# Patient Record
Sex: Female | Born: 1935 | Race: White | Smoking: Never smoker
Health system: Southern US, Community
[De-identification: ages and names within clinical notes are randomized; demographics above are authoritative.]

## PROBLEM LIST (undated history)

## (undated) DIAGNOSIS — I639 Cerebral infarction, unspecified: Secondary | ICD-10-CM

## (undated) DIAGNOSIS — I2510 Atherosclerotic heart disease of native coronary artery without angina pectoris: Secondary | ICD-10-CM

## (undated) DIAGNOSIS — K5730 Diverticulosis of large intestine without perforation or abscess without bleeding: Secondary | ICD-10-CM

## (undated) DIAGNOSIS — H409 Unspecified glaucoma: Secondary | ICD-10-CM

## (undated) DIAGNOSIS — E039 Hypothyroidism, unspecified: Secondary | ICD-10-CM

## (undated) DIAGNOSIS — E079 Disorder of thyroid, unspecified: Secondary | ICD-10-CM

## (undated) HISTORY — PX: OTHER SURGICAL HISTORY: SHX169

## (undated) HISTORY — PX: CORONARY ANGIOPLASTY: SHX604

## (undated) SURGERY — COLONOSCOPY
Anesthesia: Monitor Anesthesia Care

---

## 2014-07-28 ENCOUNTER — Inpatient Hospital Stay: Payer: Medicare Other

## 2014-07-28 ENCOUNTER — Encounter: Payer: Self-pay | Admitting: Emergency Medicine

## 2014-07-28 ENCOUNTER — Inpatient Hospital Stay
Admission: EM | Admit: 2014-07-28 | Discharge: 2014-08-01 | DRG: 379 | Disposition: A | Discharge status: Home / Self Care | Payer: Medicare Other | Attending: Internal Medicine | Admitting: Internal Medicine

## 2014-07-28 DIAGNOSIS — K922 Gastrointestinal hemorrhage, unspecified: Secondary | ICD-10-CM

## 2014-07-28 DIAGNOSIS — I2510 Atherosclerotic heart disease of native coronary artery without angina pectoris: Secondary | ICD-10-CM | POA: Diagnosis present

## 2014-07-28 DIAGNOSIS — Z7982 Long term (current) use of aspirin: Secondary | ICD-10-CM | POA: Diagnosis not present

## 2014-07-28 DIAGNOSIS — Z9889 Other specified postprocedural states: Secondary | ICD-10-CM

## 2014-07-28 DIAGNOSIS — Z79899 Other long term (current) drug therapy: Secondary | ICD-10-CM | POA: Diagnosis not present

## 2014-07-28 DIAGNOSIS — E039 Hypothyroidism, unspecified: Secondary | ICD-10-CM | POA: Diagnosis present

## 2014-07-28 DIAGNOSIS — Z8249 Family history of ischemic heart disease and other diseases of the circulatory system: Secondary | ICD-10-CM

## 2014-07-28 DIAGNOSIS — Z9861 Coronary angioplasty status: Secondary | ICD-10-CM | POA: Diagnosis not present

## 2014-07-28 DIAGNOSIS — I10 Essential (primary) hypertension: Secondary | ICD-10-CM | POA: Diagnosis present

## 2014-07-28 DIAGNOSIS — H409 Unspecified glaucoma: Secondary | ICD-10-CM | POA: Diagnosis present

## 2014-07-28 DIAGNOSIS — Z800 Family history of malignant neoplasm of digestive organs: Secondary | ICD-10-CM | POA: Diagnosis not present

## 2014-07-28 DIAGNOSIS — K921 Melena: Secondary | ICD-10-CM

## 2014-07-28 DIAGNOSIS — K5731 Diverticulosis of large intestine without perforation or abscess with bleeding: Secondary | ICD-10-CM | POA: Diagnosis present

## 2014-07-28 DIAGNOSIS — K625 Hemorrhage of anus and rectum: Secondary | ICD-10-CM | POA: Diagnosis present

## 2014-07-28 HISTORY — DX: Disorder of thyroid, unspecified: E07.9

## 2014-07-28 HISTORY — DX: Diverticulosis of large intestine without perforation or abscess without bleeding: K57.30

## 2014-07-28 HISTORY — DX: Atherosclerotic heart disease of native coronary artery without angina pectoris: I25.10

## 2014-07-28 HISTORY — DX: Unspecified glaucoma: H40.9

## 2014-07-28 HISTORY — DX: Hypothyroidism, unspecified: E03.9

## 2014-07-28 LAB — ABO/RH: ABO/RH(D): O POS

## 2014-07-28 LAB — COMPREHENSIVE METABOLIC PANEL
ALBUMIN: 3.8 g/dL (ref 3.5–5.0)
ALT: 17 U/L (ref 14–54)
AST: 24 U/L (ref 15–41)
Alkaline Phosphatase: 68 U/L (ref 38–126)
Anion gap: 11 (ref 5–15)
BUN: 25 mg/dL — ABNORMAL HIGH (ref 6–20)
CO2: 25 mmol/L (ref 22–32)
Calcium: 8.9 mg/dL (ref 8.9–10.3)
Chloride: 102 mmol/L (ref 101–111)
Creatinine, Ser: 0.81 mg/dL (ref 0.44–1.00)
GFR calc Af Amer: >60 mL/min (ref >60)
GFR calc non Af Amer: >60 mL/min (ref >60)
GLUCOSE: 132 mg/dL — AB (ref 65–99)
Potassium: 4 mmol/L (ref 3.5–5.1)
Sodium: 138 mmol/L (ref 135–145)
Total Bilirubin: 0.6 mg/dL (ref 0.3–1.2)
Total Protein: 7 g/dL (ref 6.5–8.1)

## 2014-07-28 LAB — CBC
HCT: 36.9 % (ref 35.0–47.0)
Hemoglobin: 12.2 g/dL (ref 12.0–16.0)
MCH: 30.9 pg (ref 26.0–34.0)
MCHC: 33 g/dL (ref 32.0–36.0)
MCV: 93.4 fL (ref 80.0–100.0)
Platelets: 242 10*3/uL (ref 150–440)
RBC: 3.95 MIL/uL (ref 3.80–5.20)
RDW: 14.5 % (ref 11.5–14.5)
WBC: 12.1 10*3/uL — AB (ref 3.6–11.0)

## 2014-07-28 LAB — HEMOGLOBIN AND HEMATOCRIT, BLOOD
HCT: 28.4 % — ABNORMAL LOW (ref 35.0–47.0)
HEMOGLOBIN: 9.7 g/dL — AB (ref 12.0–16.0)

## 2014-07-28 LAB — TYPE AND SCREEN
ABO/RH(D): O POS
ANTIBODY SCREEN: NEGATIVE

## 2014-07-28 LAB — TROPONIN I: Troponin I: <0.03 ng/mL (ref <0.031)

## 2014-07-28 MED ORDER — LEVOTHYROXINE SODIUM 112 MCG PO TABS
112.0000 ug | ORAL_TABLET | Freq: Every day | ORAL | Status: DC
  Administered 2014-07-29 – 2014-08-01 (×4): 112 ug via ORAL
  Filled 2014-07-28 (×4): qty 1

## 2014-07-28 MED ORDER — ACETAMINOPHEN 650 MG RE SUPP: 650.0000 mg | Freq: Four times a day (QID) | RECTAL | Status: DC | PRN

## 2014-07-28 MED ORDER — LATANOPROST 0.005 % OP SOLN
1.0000 [drp] | Freq: Every day | OPHTHALMIC | Status: DC
  Administered 2014-07-28 – 2014-07-31 (×4): 1 [drp] via OPHTHALMIC
  Filled 2014-07-28: qty 2.5

## 2014-07-28 MED ORDER — DORZOLAMIDE HCL-TIMOLOL MAL 2-0.5 % OP SOLN
1.0000 [drp] | Freq: Two times a day (BID) | OPHTHALMIC | Status: DC
  Administered 2014-07-28 – 2014-08-01 (×8): 1 [drp] via OPHTHALMIC
  Filled 2014-07-28: qty 10

## 2014-07-28 MED ORDER — SODIUM CHLORIDE 0.9 % IV SOLN
INTRAVENOUS | Status: DC
  Administered 2014-07-28 – 2014-07-29 (×3): via INTRAVENOUS
  Administered 2014-07-30: 1000 mL via INTRAVENOUS

## 2014-07-28 MED ORDER — ONDANSETRON HCL 4 MG/2ML IJ SOLN: 4.0000 mg | Freq: Four times a day (QID) | INTRAMUSCULAR | Status: DC | PRN

## 2014-07-28 MED ORDER — TECHNETIUM TC 99M-LABELED RED BLOOD CELLS IV KIT
20.0000 | PACK | Freq: Once | INTRAVENOUS | Status: AC | PRN
  Administered 2014-07-28: 21.774 via INTRAVENOUS

## 2014-07-28 MED ORDER — ONDANSETRON HCL 4 MG PO TABS: 4.0000 mg | ORAL_TABLET | Freq: Four times a day (QID) | ORAL | Status: DC | PRN

## 2014-07-28 MED ORDER — SODIUM CHLORIDE 0.9 % IV BOLUS (SEPSIS)
1000.0000 mL | Freq: Once | INTRAVENOUS | Status: AC
  Administered 2014-07-28: 1000 mL via INTRAVENOUS

## 2014-07-28 MED ORDER — BRIMONIDINE TARTRATE 0.2 % OP SOLN
1.0000 [drp] | Freq: Two times a day (BID) | OPHTHALMIC | Status: DC
  Administered 2014-07-28 – 2014-08-01 (×8): 1 [drp] via OPHTHALMIC
  Filled 2014-07-28: qty 5

## 2014-07-28 MED ORDER — ACETAMINOPHEN 325 MG PO TABS
650.0000 mg | ORAL_TABLET | Freq: Four times a day (QID) | ORAL | Status: DC | PRN
  Administered 2014-07-30: 650 mg via ORAL
  Filled 2014-07-28: qty 2

## 2014-07-28 MED ORDER — METOPROLOL SUCCINATE ER 25 MG PO TB24
12.5000 mg | ORAL_TABLET | Freq: Every day | ORAL | Status: DC
  Administered 2014-07-29 – 2014-08-01 (×3): 12.5 mg via ORAL
  Filled 2014-07-28 (×4): qty 1

## 2014-07-28 NOTE — H&P (Signed)
[facility] - [place] at [facility]   PATIENT NAME: Carol Lucero    MR#:  [id number]  DATE OF BIRTH:  Apr 17, 1926  DATE OF ADMISSION:  07/28/2014  PRIMARY CARE PHYSICIAN: No PCP Per Patient   REQUESTING/REFERRING PHYSICIAN: DR schaevitz  CHIEF COMPLAINT:  BRBPR today  HISTORY OF PRESENT ILLNESS:  Carol Lucero  is a 81 y.o. female with a known history of hypothyroidism, Meningioma s/o removal in 1999, glaucoma comes to the ER with Rectal bleed patient so far has had 4-5 large bright red blood per rectum. She has had 2 in the emergency room. Denies any abdominal pain fever or nausea or vomiting. She is otherwise hemodynamically stable she is being admitted for further evaluation on her rectal bleeding.  Patient has had colonoscopy about 5 years ago couple polyps were removed and was told she has tics. Denies any rectal bleed in the past. She currently is on a baby aspirin daily.  PAST MEDICAL HISTORY:   Past Medical History  Diagnosis Date  . Thyroid disease   . Coronary artery disease   . Hypothyroidism   . Glaucoma   . Diverticulosis of colon     seen on colonsocpy 5 years ago    PAST SURGICAL HISTOIRY:   Past Surgical History  Procedure Laterality Date  . Coronary angioplasty    . Uterine polypectomy N/A     SOCIAL HISTORY:   History  Substance Use Topics  . Smoking status: Never Smoker   . Smokeless tobacco: Not on file  . Alcohol Use: No    FAMILY HISTORY:   Family History  Problem Relation Age of Onset  . CAD Mother   . Hypertension Mother   . CAD Father     DRUG ALLERGIES:  No Known Allergies  REVIEW OF SYSTEMS:  ROS   MEDICATIONS AT HOME:   Prior to Admission medications   Medication Sig Start Date End Date Taking? Authorizing Provider  aspirin EC 81 MG tablet Take 162 mg by mouth daily.   Yes Historical Provider, MD  brimonidine (ALPHAGAN) 0.2 % ophthalmic solution Place into both eyes 2 (two) times daily.    Yes Historical Provider, MD  dorzolamide-timolol (COSOPT) 22.3-6.8 MG/ML ophthalmic solution Place 1 drop into both eyes 2 (two) times daily.   Yes Historical Provider, MD  levothyroxine (SYNTHROID, LEVOTHROID) 112 MCG tablet Take 112 mcg by mouth daily.   Yes Historical Provider, MD  metoprolol succinate (TOPROL-XL) 50 MG 24 hr tablet Take 12.5 mg by mouth daily. Take with or immediately following a meal.   Yes Historical Provider, MD  Travoprost, BAK Free, (TRAVATAN) 0.004 % SOLN ophthalmic solution Place 1 drop into both eyes.   Yes Historical Provider, MD      VITAL SIGNS:  Blood pressure 136/82, pulse 90, temperature 98.3 F (36.8 C), temperature source Oral, resp. rate 16, height  (1.575 m), weight 63.957 kg (141 lb), SpO2 97 %.  PHYSICAL EXAMINATION:  GENERAL:  81 y.o.-year-old patient lying in the bed with no acute distress.  EYES: Pupils equal, round, reactive to light and accommodation. No scleral icterus. Extraocular muscles intact.  HEENT: Head atraumatic, normocephalic. Oropharynx and nasopharynx clear.  NECK:  Supple, no jugular venous distention. No thyroid enlargement, no tenderness.  LUNGS: Normal breath sounds bilaterally, no wheezing, rales,rhonchi or crepitation. No use of accessory muscles of respiration.  CARDIOVASCULAR: S1, S2 normal. No murmurs, rubs, or gallops.  ABDOMEN: Soft, nontender, nondistended. Bowel sounds present. No organomegaly or mass.  EXTREMITIES: No pedal edema, cyanosis, or clubbing.  NEUROLOGIC: Cranial nerves II through XII are intact. Muscle strength 5/5 in all extremities. Sensation intact. Gait not checked.  PSYCHIATRIC: The patient is alert and oriented x 3.  SKIN: No obvious rash, lesion, or ulcer.   LABORATORY PANEL:   CBC  Recent Labs Lab 07/28/14 0921  WBC 12.1*  HGB 12.2  HCT 36.9  PLT 242   ------------------------------------------------------------------------------------------------------------------  Chemistries    Recent Labs Lab 07/28/14 0921  NA 138  K 4.0  CL 102  CO2 25  GLUCOSE 132*  BUN 25*  CREATININE 0.81  CALCIUM 8.9  AST 24  ALT 17  ALKPHOS 68  BILITOT 0.6   ------------------------------------------------------------------------------------------------------------------  Cardiac Enzymes  Recent Labs Lab 07/28/14 0921  TROPONINI <0.03    EKG:  NSR IMPRESSION AND PLAN:   81 year old Caucasian female with past medical history of hypothyroidism, glaucoma, meningioma status post surgery comes to the emergency room with bright red blood per rectum at home. Patient is being admitted with  #1 lower GI bleed/rectal bleed. Admit patient to medical floor Etiology appears diverticular. Patient continues to have rectal bleed in the emergency room will get a stat GI bleeding scan. H&H every 12 IV fluids Spoke with Dr. Markham Jordan gastroenterology who will see patient in consultation Transfuse as needed. Hold aspirin  #2 hypothyroidism continue Synthroid  #3 history of glaucoma Continue eyedrops  #4 DVT prophylaxis teds and SCDs. Avoid antiplatelet due to rectal bleed   D/w dter in the ER  All the records are reviewed and case discussed with ED provider. Management plans discussed with the patient, family and they are in agreement.  CODE STATUS: FULL CODE  TOTAL TIME TAKING CARE OF THIS PATIENT: 50 minutes.    [name] M.D on 07/28/2014 at 11:19 AM  Between 7am to 6pm - Pager - [phone]  After 6pm go to [email or web address] - password [email or web address] [facility]  [place] [facility]  Office  [phone]  CC: Primary care physician; No PCP Per Patient

## 2014-07-28 NOTE — ED Notes (Signed)
MD in to see patient. To be admitted. Patient up to BR. Large amount of bright red blood with stool. Patient helped back to bed and placed on monitor. Family at bedside.

## 2014-07-28 NOTE — ED Notes (Signed)
[phone] 

## 2014-07-28 NOTE — ED Notes (Signed)
Patient made aware of bleeding scan. Awaiting tech.

## 2014-07-28 NOTE — Consult Note (Signed)
GI Inpatient Consult Note  Reason for Consult: GI Bleed Per Rectum   Attending Requesting Consult: Dr. Allena Katz  History of Present Illness: Carol Lucero is a 81 y.o. female with multiple episodes of rectal bleeding.  She reports that she got up at 0630 today thinking she had to have a BM and she noticed blood in the stool.  She had a few more episodes of rectal bleeding before coming to ED.  She had another BM the was witnessed in the ED with dark maroon colored in the toilet hat.  She continues to have them with another BM right after being admitted.  She denies any abdominal pain, cramping, nausea or vomiting.  She denies fevers or chills.  She reports that her last colonoscopy was done in [place] before she turned 82.  She believes that they told her that she had diverticulosis.  She denies ever having diverticulitis before, nor has she had any rectal bleeding like this prior.  She endorses a daily ASA.  She denies being sick recently, and her last normal BM was yesterday.  She reports a significant history of colon cancer in her family, her father and her two brothers.  She lives in the [place] area, and is visiting her daughter here in [place].  Past Medical History:  Past Medical History  Diagnosis Date  . Thyroid disease   . Coronary artery disease   . Hypothyroidism   . Glaucoma   . Diverticulosis of colon     seen on colonsocpy 5 years ago    Problem List: There are no active problems to display for this patient.   Past Surgical History: Past Surgical History  Procedure Laterality Date  . Coronary angioplasty    . Uterine polypectomy N/A     Allergies: No Known Allergies  Home Medications:  (Not in a hospital admission) Home medication reconciliation was completed with the patient.   Scheduled Inpatient Medications:    Continuous Inpatient Infusions:    PRN Inpatient Medications:    Family History: family history includes CAD in her father and mother;  Hypertension in her mother.  The patient's family history is positive for colon cancer in the her father and two brothers. Social History:   reports that she has never smoked. She does not have any smokeless tobacco history on file. She reports that she does not drink alcohol. The patient denies ETOH, tobacco, or drug use.   Review of Systems: Constitutional: Weight is stable.  Eyes: No changes in vision. ENT: No oral lesions, sore throat.  GI: see HPI.  Heme/Lymph: No easy bruising.  CV: No chest pain.  GU: No hematuria.  Integumentary: No rashes.  Neuro: No headaches.  Psych: No depression/anxiety.  Endocrine: No heat/cold intolerance.  Allergic/Immunologic: No urticaria.  Resp: No cough, SOB.  Musculoskeletal: No joint swelling.    Physical Examination: BP 136/82 mmHg  Pulse 90  Temp(Src) 98.3 F (36.8 C) (Oral)  Resp 16  Ht  (1.575 m)  Wt 63.957 kg (141 lb)  BMI 25.78 kg/m2  SpO2 97% Gen: NAD, alert and oriented x 4. Daughter was present with her.  HEENT: PEERLA, EOMI, Neck: supple, no JVD or thyromegaly Chest: CTA bilaterally, no wheezes, crackles, or other adventitious sounds CV: RRR, no m/g/c/r Abd: soft, NT, ND, +BS in all four quadrants; no HSM, guarding, ridigity, or rebound tenderness Ext: no edema, well perfused with 2+ pulses, Skin: no rash or lesions noted Lymph: no LAD  Data: Lab Results  Component  Value Date   WBC 12.1* 07/28/2014   HGB 12.2 07/28/2014   HCT 36.9 07/28/2014   MCV 93.4 07/28/2014   PLT 242 07/28/2014    Recent Labs Lab 07/28/14 0921  HGB 12.2   Lab Results  Component Value Date   NA 138 07/28/2014   K 4.0 07/28/2014   CL 102 07/28/2014   CO2 25 07/28/2014   BUN 25* 07/28/2014   CREATININE 0.81 07/28/2014   Lab Results  Component Value Date   ALT 17 07/28/2014   AST 24 07/28/2014   ALKPHOS 68 07/28/2014   BILITOT 0.6 07/28/2014   No results for input(s): APTT, INR, PTT in the last 168  hours. Assessment/Plan: [name] is a 81 y.o. female with GI bleed per rectum Recommendations: We agree that this is likely diverticular in etiology.  We agree with the GI bleed scan, H and H every 12 hours, IV fluids, transfuse as needed and hold ASA.  We will continue to follow with you. Thank you for the consult. Please call with questions or concerns.  Carney Harder, PA-C  I personally performed these services.

## 2014-07-28 NOTE — ED Provider Notes (Signed)
[facility] Emergency Department Provider Note  ____________________________________________  Time seen: Approximately 9 AM  I have reviewed the triage vital signs and the nursing notes.   HISTORY  Chief Complaint Rectal Bleeding    HPI Carol Lucero is a 81 y.o. female with a history of hypertension presents with multiple episodes of bright red blood per rectum this morning. Said that she had 3 episodes at home starting at 6:30 this morning. Denies any abdominal pain, dizziness or shortness of breath. Does say that she is starting to feel weak. Has never had GI bleeding before but says she may have diverticulosis. Only blood thinner she takes is aspirin.   Past Medical History  Diagnosis Date  . Thyroid disease     There are no active problems to display for this patient.   History reviewed. No pertinent past surgical history.  Current Outpatient Rx  Name  Route  Sig  Dispense  Refill  . aspirin EC 81 MG tablet   Oral   Take 162 mg by mouth daily.         . brimonidine (ALPHAGAN) 0.2 % ophthalmic solution   Both Eyes   Place into both eyes 2 (two) times daily.         . dorzolamide-timolol (COSOPT) 22.3-6.8 MG/ML ophthalmic solution   Both Eyes   Place 1 drop into both eyes 2 (two) times daily.         Marland Kitchen levothyroxine (SYNTHROID, LEVOTHROID) 112 MCG tablet   Oral   Take 112 mcg by mouth daily.         . metoprolol succinate (TOPROL-XL) 50 MG 24 hr tablet   Oral   Take 12.5 mg by mouth daily. Take with or immediately following a meal.         . Travoprost, BAK Free, (TRAVATAN) 0.004 % SOLN ophthalmic solution   Both Eyes   Place 1 drop into both eyes.           Allergies Review of patient's allergies indicates no known allergies.  History reviewed. No pertinent family history.  Social History History  Substance Use Topics  . Smoking status: Never Smoker   . Smokeless tobacco: Not on file  . Alcohol Use: No     Review of Systems Constitutional: No fever/chills Eyes: No visual changes. ENT: No sore throat. Cardiovascular: Denies chest pain. Respiratory: Denies shortness of breath. Gastrointestinal: No abdominal pain.  No nausea, no vomiting.  No constipation. Genitourinary: Negative for dysuria. Musculoskeletal: Negative for back pain. Skin: Negative for rash. Neurological: Negative for headaches, focal weakness or numbness.  10-point ROS otherwise negative.  ____________________________________________   PHYSICAL EXAM:  VITAL SIGNS: ED Triage Vitals  Enc Vitals Group     BP 07/28/14 0905 144/69 mmHg     Pulse Rate 07/28/14 0905 94     Resp 07/28/14 0905 22     Temp 07/28/14 0905 98.3 F (36.8 C)     Temp Source 07/28/14 0905 Oral     SpO2 07/28/14 0905 98 %     Weight 07/28/14 0905 141 lb (63.957 kg)     Height 07/28/14 0905  (1.575 m)     Head Cir --      Peak Flow --      Pain Score --      Pain Loc --      Pain Edu? --      Excl. in GC? --     Constitutional: Alert and oriented. Well  appearing and in no acute distress. Eyes: Conjunctivae are normal. PERRL. EOMI. Head: Atraumatic. Nose: No congestion/rhinnorhea. Mouth/Throat: Mucous membranes are moist.  Oropharynx non-erythematous. Neck: No stridor.   Cardiovascular: Normal rate, regular rhythm. Grossly normal heart sounds.  Good peripheral circulation. Respiratory: Normal respiratory effort.  No retractions. Lungs CTAB. Gastrointestinal: Soft and nontender. No distention. No CVA tenderness. Dark maroon blood in the toilet hat. Musculoskeletal: No lower extremity tenderness nor edema.  No joint effusions. Neurologic:  Normal speech and language. No gross focal neurologic deficits are appreciated. Speech is normal. No gait instability. Skin:  Skin is warm, dry and intact. No rash noted. Psychiatric: Mood and affect are normal. Speech and behavior are normal.  ____________________________________________    LABS (all labs ordered are listed, but only abnormal results are displayed)  Labs Reviewed  CBC - Abnormal; Notable for the following:    WBC 12.1 (*)    All other components within normal limits  COMPREHENSIVE METABOLIC PANEL  TROPONIN I  TYPE AND SCREEN   ____________________________________________  EKG  ED ECG REPORT I, Arelia Longest, the attending physician, personally viewed and interpreted this ECG.   Date: 07/28/2014  EKG Time: 921  Rate: 85  Rhythm: normal sinus rhythm  Axis: Normal axis  Intervals:none  ST&T Change: T wave inversions in 1, aVL as well as V3 through 5. No ST elevations or depressions. No previous EKGs for comparison.  ____________________________________________  RADIOLOGY   ____________________________________________   PROCEDURES    ____________________________________________   INITIAL IMPRESSION / ASSESSMENT AND PLAN / ED COURSE  Pertinent labs & imaging results that were available during my care of the patient were reviewed by me and considered in my medical decision making (see chart for details).  ----------------------------------------- 9:44 AM on 07/28/2014 -----------------------------------------  Patient with 4+ episodes of GI bleeding including one episode in the emergency department. We'll admit. Hemodynamically stable at this time. Signed out to Dr. Allena Katz. Bleeding scan ordered. Dr. Allena Katz to follow-up with labs and bleeding scan. ____________________________________________   FINAL CLINICAL IMPRESSION(S) / ED DIAGNOSES  Final diagnoses:  GI bleed      Myrna Blazer, MD 07/28/14 [phone]

## 2014-07-28 NOTE — ED Notes (Signed)
States she noticed blood in stool this am times 3

## 2014-07-28 NOTE — Consult Note (Signed)
Pt tagged RBC nuclear scan was neg per reading radiologist.  Nurse informed to tell the family and I will speak to them and the patient later.

## 2014-07-29 ENCOUNTER — Encounter: Payer: Self-pay | Admitting: Radiology

## 2014-07-29 ENCOUNTER — Inpatient Hospital Stay: Payer: Medicare Other

## 2014-07-29 LAB — HEMOGLOBIN AND HEMATOCRIT, BLOOD
HCT: 26.2 % — ABNORMAL LOW (ref 35.0–47.0)
HCT: 29 % — ABNORMAL LOW (ref 35.0–47.0)
Hemoglobin: 8.7 g/dL — ABNORMAL LOW (ref 12.0–16.0)
Hemoglobin: 9.6 g/dL — ABNORMAL LOW (ref 12.0–16.0)

## 2014-07-29 LAB — HEMOGLOBIN: Hemoglobin: 10 g/dL — ABNORMAL LOW (ref 12.0–16.0)

## 2014-07-29 NOTE — Consult Note (Signed)
Carol Lucero reports recent BM with dark red / BRB, she had not had a BM since yesterday at 4 and has not had any bleeding until latest BM.  Her Hgb has consistently trended down 12.1 ; 9.7. 8.7 this morning. She reports she feels rested, but has a headache coming on.  We will order a STAT GI bleed scan, transfuse and consult with vascular surgery.  Full progress note to follow.  Carol Lucero S. Paul Half  I personally performed these services.

## 2014-07-29 NOTE — Care Management Note (Signed)
Case Management Note  Patient Details  Name: Carol Lucero MRN: [id number] Date of Birth: Jun 03, 1926  Subjective/Objective:                    Action/Plan:   Expected Discharge Date:                Expected Discharge Plan:     In-House Referral:     Discharge planning Services     Post Acute Care Choice:    Choice offered to:     DME Arranged:    DME Agency:     HH Arranged:    HH Agency:     Status of Service:     Medicare Important Message Given:   yes Date Medicare IM Given:   07/29/14 Medicare IM give by:   L.Sacora Hawbaker, RN Date Additional Medicare IM Given:    Additional Medicare Important Message give by:     If discussed at Long Length of Stay Meetings, dates discussed:    Additional Comments:  Carol Lucero A, RN 07/29/2014, 10:04 AM

## 2014-07-29 NOTE — Progress Notes (Signed)
[facility] - [place] at [facility]   PATIENT NAME: Carol ChiquitoMarianne Lucero    MR#:  [id number]  DATE OF BIRTH:  04/18/1926  SUBJECTIVE:  Small bloody stool today. No compalints  REVIEW OF SYSTEMS:   Review of Systems  Constitutional: Negative for fever, chills and weight loss.  HENT: Negative for ear discharge, ear pain and nosebleeds.   Eyes: Negative for blurred vision, pain and discharge.  Respiratory: Negative for sputum production, shortness of breath, wheezing and stridor.   Cardiovascular: Negative for chest pain, palpitations, orthopnea and PND.  Gastrointestinal: Positive for blood in stool. Negative for nausea, vomiting, abdominal pain and diarrhea.  Genitourinary: Negative for urgency and frequency.  Musculoskeletal: Negative for back pain and joint pain.  Neurological: Negative for sensory change, speech change, focal weakness and weakness.  Psychiatric/Behavioral: Negative for depression. The patient is not nervous/anxious.   All other systems reviewed and are negative.  Tolerating Diet:yes clears Tolerating PT: not needed  DRUG ALLERGIES:  No Known Allergies  VITALS:  Blood pressure 122/58, pulse 99, temperature 97.5 F (36.4 C), temperature source Oral, resp. rate 18, height 5\' 2"  (1.575 m), weight 63.957 kg (141 lb), SpO2 100 %.  PHYSICAL EXAMINATION:   Physical Exam  GENERAL:  81 y.o.-year-old patient lying in the bed with no acute distress.  EYES: Pupils equal, round, reactive to light and accommodation. No scleral icterus. Extraocular muscles intact.  HEENT: Head atraumatic, normocephalic. Oropharynx and nasopharynx clear.  NECK:  Supple, no jugular venous distention. No thyroid enlargement, no tenderness.  LUNGS: Normal breath sounds bilaterally, no wheezing, rales, rhonchi. No use of accessory muscles of respiration.  CARDIOVASCULAR: S1, S2 normal. No murmurs, rubs, or gallops.  ABDOMEN: Soft, nontender, nondistended. Bowel sounds  present. No organomegaly or mass.  EXTREMITIES: No cyanosis, clubbing or edema b/l.    NEUROLOGIC: Cranial nerves II through XII are intact. No focal Motor or sensory deficits b/l.   PSYCHIATRIC: The patient is alert and oriented x 3.  SKIN: No obvious rash, lesion, or ulcer.    LABORATORY PANEL:   CBC  Recent Labs Lab 07/28/14 0921  07/29/14 0827 07/29/14 1313  WBC 12.1*  --   --   --   HGB 12.2  < > 8.7* 10.0*  HCT 36.9  < > 26.2*  --   PLT 242  --   --   --   < > = values in this interval not displayed.  Chemistries   Recent Labs Lab 07/28/14 0921  NA 138  K 4.0  CL 102  CO2 25  GLUCOSE 132*  BUN 25*  CREATININE 0.81  CALCIUM 8.9  AST 24  ALT 17  ALKPHOS 68  BILITOT 0.6    Cardiac Enzymes  Recent Labs Lab 07/28/14 0921  TROPONINI <0.03    RADIOLOGY:  Nm Gi Blood Loss  07/28/2014   CLINICAL DATA:  Hematochezia  EXAM: NUCLEAR MEDICINE GASTROINTESTINAL BLEEDING SCAN  TECHNIQUE: Sequential abdominal images were obtained following intravenous administration of Tc-7613m labeled red blood cells.  RADIOPHARMACEUTICALS:  21.77 mCi Tc-2613m in-vitro labeled red cells.  COMPARISON:  None.  FINDINGS: Adequate uptake is noted throughout the vascular structures as well as the liver with some pooling on delayed imaging within the bladder. No focus of increased activity to suggest active hemorrhage is identified.  IMPRESSION: No evidence of active GI hemorrhage.  These results will be called to the ordering clinician or representative by the Radiologist Assistant, and communication documented in the PACS or  zVision Dashboard.   Electronically Signed   By: Alcide Clever M.D.   On: 07/28/2014 16:44     ASSESSMENT AND PLAN:   81 year old Caucasian female with past medical history of hypothyroidism, glaucoma, meningioma status post surgery comes to the emergency room with bright red blood per rectum at home. Patient is being admitted with  #1 lower GI bleed/rectal bleed.Etiology  appears diverticular. Patient continues to have rectal bleed in the emergency room will get a stat GI bleeding scan. -hgb 12.7-->8.7-->10.0 IV fluids  Dr. Markham Jordan gastroenterology's im[put appreciated Transfuse as needed. Hold aspirin -GI bleeding scan 07/28/14 negative  #2 hypothyroidism continue Synthroid  #3 history of glaucoma Continue eyedrops  #4 DVT prophylaxis teds and SCDs. Avoid antiplatelet due to rectal bleed  Case discussed with Care Management Management plans discussed with the patient, family and they are in agreement.  CODE STATUS: Full DVT Prophylaxis: teds  TOTAL TIME TAKING CARE OF THIS PATIENT: 35 minutes.  >50% time spent on counselling and coordination of care  POSSIBLE D/C IN 1-2 DAYS, DEPENDING ON CLINICAL CONDITION.   Khushbu Pippen M.D on 07/29/2014 at 2:54 PM  Between 7am to 6pm - Pager - [phone]  After 6pm go to [email or web address] - password [email or web address] [facility]  [place] [facility]  Office  [phone]  CC: Primary care physician; No PCP Per Patient

## 2014-07-29 NOTE — Consult Note (Signed)
GI Inpatient Follow-up Note  Patient Identification: Carol Lucero is a 81 y.o. female with GI Bleed per rectum  Subjective: Ms. Carol Lucero reports recent BM with dark red / BRB, she had not had a BM since yesterday at 4 and has not had any bleeding until latest BM. Her Hgb has consistently trended down 12.1 ; 9.7. 8.7 this morning. She reports she feels rested, but has a headache coming on. We will order a STAT GI bleed scan, transfuse and consult with vascular surgery.  Scheduled Inpatient Medications:  . brimonidine  1 drop Both Eyes BID  . dorzolamide-timolol  1 drop Both Eyes BID  . latanoprost  1 drop Both Eyes QHS  . levothyroxine  112 mcg Oral QAC breakfast  . metoprolol succinate  12.5 mg Oral Daily    Continuous Inpatient Infusions:   . sodium chloride 50 mL/hr at 07/29/14 1743    PRN Inpatient Medications:  acetaminophen **OR** acetaminophen, ondansetron **OR** ondansetron (ZOFRAN) IV  Review of Systems: Constitutional: Weight is stable.  Eyes: No changes in vision. ENT: No oral lesions, sore throat.  GI: see HPI.  Heme/Lymph: No easy bruising.  CV: No chest pain.  GU: No hematuria.  Integumentary: No rashes.  Neuro: Mild headache.  Psych: No depression/anxiety.  Endocrine: No heat/cold intolerance.  Allergic/Immunologic: No urticaria.  Resp: No cough, SOB.  Musculoskeletal: No joint swelling.    Physical Examination: BP 128/56 mmHg  Pulse 62  Temp(Src) 97.8 F (36.6 C) (Oral)  Resp 18  Ht 5\' 2"  (1.575 m)  Wt 63.957 kg (141 lb)  BMI 25.78 kg/m2  SpO2 100%  LMP  Gen: NAD, alert and oriented x 4 HEENT: PEERLA, EOMI, Neck: supple, no JVD or thyromegaly Chest: CTA bilaterally, no wheezes, crackles, or other adventitious sounds CV: RRR, no m/g/c/r Abd: soft, NT, ND, +BS in all four quadrants; no HSM, guarding, ridigity, or rebound tenderness Ext: no edema, well perfused with 2+ pulses, Skin: no rash or lesions noted Lymph: no LAD  Data: Lab  Results  Component Value Date   WBC 12.1* 07/28/2014   HGB 10.0* 07/29/2014   HCT 26.2* 07/29/2014   MCV 93.4 07/28/2014   PLT 242 07/28/2014    Recent Labs Lab 07/28/14 1832 07/29/14 0827 07/29/14 1313  HGB 9.7* 8.7* 10.0*   Lab Results  Component Value Date   NA 138 07/28/2014   K 4.0 07/28/2014   CL 102 07/28/2014   CO2 25 07/28/2014   BUN 25* 07/28/2014   CREATININE 0.81 07/28/2014   Lab Results  Component Value Date   ALT 17 07/28/2014   AST 24 07/28/2014   ALKPHOS 68 07/28/2014   BILITOT 0.6 07/28/2014   No results for input(s): APTT, INR, PTT in the last 168 hours.    CLINICAL DATA: GI of bleed study. Patient had bleeding episode at 12 p.m. on at 07/29/14.  EXAM: NUCLEAR MEDICINE GASTROINTESTINAL BLEEDING SCAN  TECHNIQUE: Sequential abdominal images were obtained following intravenous administration of Tc-6929m labeled red blood cells.  RADIOPHARMACEUTICALS: 21.77 mCi Tc-6629m in-vitro labeled red cells (administered during original exam from 07/28/2014). No re- injection.  COMPARISON: 07/28/2014  FINDINGS: At the beginning of the study there is radiotracer activity within the colon. This is presumably from study dated 07/28/2014.  During the first hour of imaging there is increased radiotracer uptake within the right lower quadrant of the abdomen which moves over time and conforms to the expected distribution of the distal small bowel loops. On the second hour there is  persistent radiotracer activity originating from the right lower quadrant of the abdomen and coursing along the distribution of the distal small bowel.  IMPRESSION: 1. Increased radiotracer uptake within the right lower quadrant of the abdomen is favored to represent active distal small bowel bleed. This would be in the distribution of the superior mesenteric artery.   Electronically Signed  By: Signa Kell M.D.  On: 07/29/2014 16:07 Assessment/Plan: Ms.  Grosso is a 81 y.o. female  with GI Bleed per rectum  Recommendations: Bleed scan was favored to represent active distal small bowel bleed.  We agree with consult to vascular surgery and close monitoring of H & H. We are surprised that the Hgb actually went up to a 10.  Recommend repeat testing to ensure that there was not a lab error. We will continue to follow with you. Please call with questions or concerns.  Carney Harder, PA-C  I personally performed these services.

## 2014-07-29 NOTE — Care Management Note (Signed)
Case Management Note  Patient Details  Name: Carol Lucero MRN: [id number] Date of Birth: 08/21/1926  Subjective/Objective:      81yo Carol Lucero was admitted 07/28/14 per c/o of new rectal bleeding. Dx of diverticulosis approximately 6 years ago per Carol Lucero. Carol Thad RangerReynolds is currently in Nuclear Medicine and information was obtained from her daughter. Carol Thad RangerReynolds resides in [place] but has recently sold her condo and plans to reside with her daughter Carol Lucero [phone] (in [place]) after this hospital discharge and for approximately 6-8 weeks thereafter. [name] has glaucoma but drives her own car, and performs all her own ADLs. Daughter Carol Lucero describes Carol Thad RangerReynolds as "very healthy and active for her age." Uses no home equipment. Discharge planning: will need a local PCP and local pharmacy. No home health or DME needs anticipated.   Expected Discharge Date:                Expected Discharge Plan:     In-House Referral:     Discharge planning Services     Post Acute Care Choice:    Choice offered to:     DME Arranged:    DME Agency:     HH Arranged:    HH Agency:     Status of Service:     Medicare Important Message Given:  Yes-second notification given Date Medicare IM Given:    Medicare IM give by:    Date Additional Medicare IM Given:    Additional Medicare Important Message give by:     If discussed at Long Length of Stay Meetings, dates discussed:    Additional Comments:  Muhannad Bignell A, RN 07/29/2014, 1:42 PM

## 2014-07-30 LAB — CBC
HEMATOCRIT: 25.2 % — AB (ref 35.0–47.0)
Hemoglobin: 8.3 g/dL — ABNORMAL LOW (ref 12.0–16.0)
MCH: 30.8 pg (ref 26.0–34.0)
MCHC: 33 g/dL (ref 32.0–36.0)
MCV: 93.2 fL (ref 80.0–100.0)
Platelets: 192 10*3/uL (ref 150–440)
RBC: 2.7 MIL/uL — ABNORMAL LOW (ref 3.80–5.20)
RDW: 14.2 % (ref 11.5–14.5)
WBC: 6 10*3/uL (ref 3.6–11.0)

## 2014-07-30 LAB — PROTIME-INR
INR: 1.06
PROTHROMBIN TIME: 14 s (ref 11.4–15.0)

## 2014-07-30 LAB — HEMOGLOBIN AND HEMATOCRIT, BLOOD
HEMATOCRIT: 26 % — AB (ref 35.0–47.0)
HEMOGLOBIN: 8.5 g/dL — AB (ref 12.0–16.0)

## 2014-07-30 NOTE — Progress Notes (Signed)
[facility] [place] at [facility]   PATIENT NAME: Carol Lucero    MR#:  [id number]  DATE OF BIRTH:  11/24/1926  SUBJECTIVE:  Continues with BRBPR durig the nite. No abd pain  REVIEW OF SYSTEMS:   Review of Systems  Constitutional: Negative for fever, chills and weight loss.  HENT: Negative for ear discharge, ear pain and nosebleeds.   Eyes: Negative for blurred vision, pain and discharge.  Respiratory: Negative for sputum production, shortness of breath, wheezing and stridor.   Cardiovascular: Negative for chest pain, palpitations, orthopnea and PND.  Gastrointestinal: Positive for blood in stool. Negative for nausea, vomiting, abdominal pain and diarrhea.  Genitourinary: Negative for urgency and frequency.  Musculoskeletal: Negative for back pain and joint pain.  Neurological: Negative for sensory change, speech change, focal weakness and weakness.  Psychiatric/Behavioral: Negative for depression. The patient is not nervous/anxious.   All other systems reviewed and are negative.  Tolerating Diet:yes clears Tolerating PT: not needed  DRUG ALLERGIES:  No Known Allergies  VITALS:  Blood pressure 133/62, pulse 65, temperature 97.8 F (36.6 C), temperature source Oral, resp. rate 19, height 5\' 2"  (1.575 m), weight 63.957 kg (141 lb), SpO2 100 %.  PHYSICAL EXAMINATION:   Physical Exam  GENERAL:  81 y.o.-year-old patient lying in the bed with no acute distress. Pallor+ EYES: Pupils equal, round, reactive to light and accommodation. No scleral icterus. Extraocular muscles intact.  HEENT: Head atraumatic, normocephalic. Oropharynx and nasopharynx clear.  NECK:  Supple, no jugular venous distention. No thyroid enlargement, no tenderness.  LUNGS: Normal breath sounds bilaterally, no wheezing, rales, rhonchi. No use of accessory muscles of respiration.  CARDIOVASCULAR: S1, S2 normal. No murmurs, rubs, or gallops.  ABDOMEN: Soft, nontender, nondistended.  Bowel sounds present. No organomegaly or mass.  EXTREMITIES: No cyanosis, clubbing or edema b/l.    NEUROLOGIC: Cranial nerves II through XII are intact. No focal Motor or sensory deficits b/l.   PSYCHIATRIC: The patient is alert and oriented x 3.  SKIN: No obvious rash, lesion, or ulcer.    LABORATORY PANEL:   CBC  Recent Labs Lab 07/28/14 0921  07/30/14 0754  WBC 12.1*  --   --   HGB 12.2  < > 8.5*  HCT 36.9  < > 26.0*  PLT 242  --   --   < > = values in this interval not displayed.  Chemistries   Recent Labs Lab 07/28/14 0921  NA 138  K 4.0  CL 102  CO2 25  GLUCOSE 132*  BUN 25*  CREATININE 0.81  CALCIUM 8.9  AST 24  ALT 17  ALKPHOS 68  BILITOT 0.6    Cardiac Enzymes  Recent Labs Lab 07/28/14 0921  TROPONINI <0.03    RADIOLOGY:  Nm Gi Blood Loss  07/29/2014   CLINICAL DATA:  GI of bleed study. Patient had bleeding episode at 12 p.m. on at 07/29/14.  EXAM: NUCLEAR MEDICINE GASTROINTESTINAL BLEEDING SCAN  TECHNIQUE: Sequential abdominal images were obtained following intravenous administration of Tc-9865m labeled red blood cells.  RADIOPHARMACEUTICALS:  21.77 mCi Tc-8065m in-vitro labeled red cells (administered during original exam from 07/28/2014). No re- injection.  COMPARISON:  07/28/2014  FINDINGS: At the beginning of the study there is radiotracer activity within the colon. This is presumably from study dated 07/28/2014.  During the first hour of imaging there is increased radiotracer uptake within the right lower quadrant of the abdomen which moves over time and conforms to the expected distribution of  the distal small bowel loops. On the second hour there is persistent radiotracer activity originating from the right lower quadrant of the abdomen and coursing along the distribution of the distal small bowel.  IMPRESSION: 1. Increased radiotracer uptake within the right lower quadrant of the abdomen is favored to represent active distal small bowel bleed. This  would be in the distribution of the superior mesenteric artery.   Electronically Signed   By: Signa Kell M.D.   On: 07/29/2014 16:07   Nm Gi Blood Loss  07/28/2014   CLINICAL DATA:  Hematochezia  EXAM: NUCLEAR MEDICINE GASTROINTESTINAL BLEEDING SCAN  TECHNIQUE: Sequential abdominal images were obtained following intravenous administration of Tc-10m labeled red blood cells.  RADIOPHARMACEUTICALS:  21.77 mCi Tc-57m in-vitro labeled red cells.  COMPARISON:  None.  FINDINGS: Adequate uptake is noted throughout the vascular structures as well as the liver with some pooling on delayed imaging within the bladder. No focus of increased activity to suggest active hemorrhage is identified.  IMPRESSION: No evidence of active GI hemorrhage.  These results will be called to the ordering clinician or representative by the Radiologist Assistant, and communication documented in the PACS or zVision Dashboard.   Electronically Signed   By: Alcide Clever M.D.   On: 07/28/2014 16:44     ASSESSMENT AND PLAN:   81 year old Caucasian female with past medical history of hypothyroidism, glaucoma, meningioma status post surgery comes to the emergency room with bright red blood per rectum at home. Patient is being admitted with  #1 lower GI bleed/rectal bleed.Etiology appears diverticular. Patient continues to have rectal bleed in the emergency room will get a stat GI bleeding scan. -hgb 12.7-->8.7-->10.0-->9.6-->8.5 IV fluids  Dr. Markham Jordan gastroenterology's in[put appreciated Transfuse as needed. Hold aspirin -GI bleeding scan 07/28/14 negative -GI bleeding scan 07/29/14 positive with distal small bowel area -spoke with Dr Lorretta Harp to see pt for need of micro bead embolization  #2 hypothyroidism continue Synthroid  #3 history of glaucoma Continue eyedrops  #4 DVT prophylaxis teds and SCDs. Avoid antiplatelet due to rectal bleed  Case discussed with Care Management Management plans discussed with the patient,  family and they are in agreement.  CODE STATUS: Full DVT Prophylaxis: teds  TOTAL TIME TAKING CARE OF THIS PATIENT: 35 minutes.  >50% time spent on counselling and coordination of care  POSSIBLE D/C IN 1-2 DAYS, DEPENDING ON CLINICAL CONDITION.   Savi Lastinger M.D on 07/30/2014 at 12:55 PM  Between 7am to 6pm - Pager - [phone]  After 6pm go to [email or web address] - password [email or web address] [facility]  [facility]  Office  [phone]  CC: Primary care physician; No PCP Per Patient

## 2014-07-30 NOTE — Progress Notes (Signed)
Spoke with Dr Anne HahnWillis concerning patient's BP 129/42. Dr Anne Hahnwillis advised to continue to monitor as patient not symptomatic at this time. No new orders.

## 2014-07-30 NOTE — Consult Note (Signed)
Pt and daughter say there has been no passaged of blood except very small smears since midnight.  Will get stat CBC. And Protime.  Due to odd nature of nuclear med study it may be best to observe her for now but will leave ultimate decision in capable hands of vascular surgeon.

## 2014-07-31 LAB — HEMOGLOBIN AND HEMATOCRIT, BLOOD
HCT: 25.5 % — ABNORMAL LOW (ref 35.0–47.0)
HEMATOCRIT: 26.1 % — AB (ref 35.0–47.0)
Hemoglobin: 8.5 g/dL — ABNORMAL LOW (ref 12.0–16.0)
Hemoglobin: 8.7 g/dL — ABNORMAL LOW (ref 12.0–16.0)

## 2014-07-31 NOTE — Consult Note (Signed)
Subjective: Patient seen for hematochezia. Likely diverticular bleeding. Patient states she has had no bowel movement for about 38 hours. There is no nausea vomiting or abdominal pain. She is tolerating clear liquids. Hemodynamically stable.  Objective: Vital signs in last 24 hours: Temp:  [98 F (36.7 C)-98.1 F (36.7 C)] 98 F (36.7 C) (06/29 0748) Pulse Rate:  [56-63] 59 (06/29 0748) Resp:  [14-16] 16 (06/29 0748) BP: (112-139)/(38-107) 139/55 mmHg (06/29 0748) SpO2:  [96 %-99 %] 99 % (06/29 0748) Blood pressure 139/55, pulse 59, temperature 98 F (36.7 C), temperature source Oral, resp. rate 16, height  (1.575 m), weight 63.957 kg (141 lb), SpO2 99 %.   Intake/Output from previous day: 06/28 0701 - 06/29 0700 In: 1649.5 [I.V.:1649.5] Out: 850 [Urine:850]  Intake/Output this shift: Total I/O In: 445 [P.O.:240; I.V.:205] Out: 150 [Urine:150]   General appearance:  Well-appearing 81 year old female no acute distress Resp:  Clear to auscultation Cardio:  Regular rate and rhythm GI:  Soft nontender nondistended bowel sounds positive normoactive Extremities:  No clubbing cyanosis or edema   Lab Results: Results for orders placed or performed during the hospital encounter of 07/28/14 (from the past 24 hour(s))  CBC     Status: Abnormal   Collection Time: 07/30/14  2:48 PM  Result Value Ref Range   WBC 6.0 3.6 - 11.0 K/uL   RBC 2.70 (L) 3.80 - 5.20 MIL/uL   Hemoglobin 8.3 (L) 12.0 - 16.0 g/dL   HCT 84.6 (L) 96.2 - 95.2 %   MCV 93.2 80.0 - 100.0 fL   MCH 30.8 26.0 - 34.0 pg   MCHC 33.0 32.0 - 36.0 g/dL   RDW 84.1 32.4 - 40.1 %   Platelets 192 150 - 440 K/uL  Protime-INR     Status: None   Collection Time: 07/30/14  2:48 PM  Result Value Ref Range   Prothrombin Time 14.0 11.4 - 15.0 seconds   INR 1.06   Hemoglobin and hematocrit, blood     Status: Abnormal   Collection Time: 07/31/14  8:21 AM  Result Value Ref Range   Hemoglobin 8.5 (L) 12.0 - 16.0 g/dL   HCT  02.7 (L) 25.3 - 47.0 %      Recent Labs  07/30/14 0754 07/30/14 1448 07/31/14 0821  WBC  --  6.0  --   HGB 8.5* 8.3* 8.5*  HCT 26.0* 25.2* 25.5*  PLT  --  192  --    BMET No results for input(s): NA, K, CL, CO2, GLUCOSE, BUN, CREATININE, CALCIUM in the last 72 hours. LFT No results for input(s): PROT, ALBUMIN, AST, ALT, ALKPHOS, BILITOT, BILIDIR, IBILI in the last 72 hours. PT/INR  Recent Labs  07/30/14 1448  LABPROT 14.0  INR 1.06   Hepatitis Panel No results for input(s): HEPBSAG, HCVAB, HEPAIGM, HEPBIGM in the last 72 hours. C-Diff No results for input(s): CDIFFTOX in the last 72 hours. No results for input(s): CDIFFPCR in the last 72 hours.   Studies/Results: Nm Gi Blood Loss  07/29/2014   CLINICAL DATA:  GI of bleed study. Patient had bleeding episode at 12 p.m. on at 07/29/14.  EXAM: NUCLEAR MEDICINE GASTROINTESTINAL BLEEDING SCAN  TECHNIQUE: Sequential abdominal images were obtained following intravenous administration of Tc-11m labeled red blood cells.  RADIOPHARMACEUTICALS:  21.77 mCi Tc-51m in-vitro labeled red cells (administered during original exam from 07/28/2014). No re- injection.  COMPARISON:  07/28/2014  FINDINGS: At the beginning of the study there is radiotracer activity within the colon. This is  presumably from study dated 07/28/2014.  During the first hour of imaging there is increased radiotracer uptake within the right lower quadrant of the abdomen which moves over time and conforms to the expected distribution of the distal small bowel loops. On the second hour there is persistent radiotracer activity originating from the right lower quadrant of the abdomen and coursing along the distribution of the distal small bowel.  IMPRESSION: 1. Increased radiotracer uptake within the right lower quadrant of the abdomen is favored to represent active distal small bowel bleed. This would be in the distribution of the superior mesenteric artery.   Electronically Signed    By: Signa Kellaylor  Stroud M.D.   On: 07/29/2014 16:07    Scheduled Inpatient Medications:   . brimonidine  1 drop Both Eyes BID  . dorzolamide-timolol  1 drop Both Eyes BID  . latanoprost  1 drop Both Eyes QHS  . levothyroxine  112 mcg Oral QAC breakfast  . metoprolol succinate  12.5 mg Oral Daily    Continuous Inpatient Infusions:     PRN Inpatient Medications:  acetaminophen **OR** acetaminophen, ondansetron **OR** ondansetron (ZOFRAN) IV  Miscellaneous:   Assessment:  1. Hematochezia. Likely diverticular bleeding. She has been hemodynamically stable without evidence of ongoing bleeding over 24 hours and hemoglobin is stable to increasing.  Plan:  1. Could advance diet to liquids tomorrow morning. Would continue that for perhaps 2 days before starting a low residue diet for 5 days. We'll follow with you as long as she is in the hospital.  Christena DeemMartin U Skulskie MD 07/31/2014, 1:53 PM

## 2014-07-31 NOTE — Progress Notes (Signed)
[facility] - [place] at [facility]   PATIENT NAME: Carol Lucero    MR#:  [id number]  DATE OF BIRTH:  1926-08-08  SUBJECTIVE:  No more bloody BM since y'day REVIEW OF SYSTEMS:   Review of Systems  Constitutional: Negative for fever, chills and weight loss.  HENT: Negative for ear discharge, ear pain and nosebleeds.   Eyes: Negative for blurred vision, pain and discharge.  Respiratory: Negative for sputum production, shortness of breath, wheezing and stridor.   Cardiovascular: Negative for chest pain, palpitations, orthopnea and PND.  Gastrointestinal: Positive for blood in stool. Negative for nausea, vomiting, abdominal pain and diarrhea.  Genitourinary: Negative for urgency and frequency.  Musculoskeletal: Negative for back pain and joint pain.  Neurological: Negative for sensory change, speech change, focal weakness and weakness.  Psychiatric/Behavioral: Negative for depression. The patient is not nervous/anxious.   All other systems reviewed and are negative.  Tolerating Diet:yes clears Tolerating PT: not needed  DRUG ALLERGIES:  No Known Allergies  VITALS:  Blood pressure 139/55, pulse 59, temperature 98 F (36.7 C), temperature source Oral, resp. rate 16, height  (1.575 m), weight 63.957 kg (141 lb), SpO2 99 %.  PHYSICAL EXAMINATION:   Physical Exam  GENERAL:  81 y.o.-year-old patient lying in the bed with no acute distress. Pallor+ EYES: Pupils equal, round, reactive to light and accommodation. No scleral icterus. Extraocular muscles intact.  HEENT: Head atraumatic, normocephalic. Oropharynx and nasopharynx clear.  NECK:  Supple, no jugular venous distention. No thyroid enlargement, no tenderness.  LUNGS: Normal breath sounds bilaterally, no wheezing, rales, rhonchi. No use of accessory muscles of respiration.  CARDIOVASCULAR: S1, S2 normal. No murmurs, rubs, or gallops.  ABDOMEN: Soft, nontender, nondistended. Bowel sounds present.  No organomegaly or mass.  EXTREMITIES: No cyanosis, clubbing or edema b/l.    NEUROLOGIC: Cranial nerves II through XII are intact. No focal Motor or sensory deficits b/l.   PSYCHIATRIC: The patient is alert and oriented x 3.  SKIN: No obvious rash, lesion, or ulcer.    LABORATORY PANEL:   CBC  Recent Labs Lab 07/30/14 1448 07/31/14 0821  WBC 6.0  --   HGB 8.3* 8.5*  HCT 25.2* 25.5*  PLT 192  --     Chemistries   Recent Labs Lab 07/28/14 0921  NA 138  K 4.0  CL 102  CO2 25  GLUCOSE 132*  BUN 25*  CREATININE 0.81  CALCIUM 8.9  AST 24  ALT 17  ALKPHOS 68  BILITOT 0.6    Cardiac Enzymes  Recent Labs Lab 07/28/14 0921  TROPONINI <0.03    RADIOLOGY:  Nm Gi Blood Loss  07/29/2014   CLINICAL DATA:  GI of bleed study. Patient had bleeding episode at 12 p.m. on at 07/29/14.  EXAM: NUCLEAR MEDICINE GASTROINTESTINAL BLEEDING SCAN  TECHNIQUE: Sequential abdominal images were obtained following intravenous administration of Tc-58m labeled red blood cells.  RADIOPHARMACEUTICALS:  21.77 mCi Tc-58m in-vitro labeled red cells (administered during original exam from 07/28/2014). No re- injection.  COMPARISON:  07/28/2014  FINDINGS: At the beginning of the study there is radiotracer activity within the colon. This is presumably from study dated 07/28/2014.  During the first hour of imaging there is increased radiotracer uptake within the right lower quadrant of the abdomen which moves over time and conforms to the expected distribution of the distal small bowel loops. On the second hour there is persistent radiotracer activity originating from the right lower quadrant of the abdomen and coursing  along the distribution of the distal small bowel.  IMPRESSION: 1. Increased radiotracer uptake within the right lower quadrant of the abdomen is favored to represent active distal small bowel bleed. This would be in the distribution of the superior mesenteric artery.   Electronically Signed    By: Signa Kellaylor  Stroud M.D.   On: 07/29/2014 16:07     ASSESSMENT AND PLAN:   81 year old Caucasian female with past medical history of hypothyroidism, glaucoma, meningioma status post surgery comes to the emergency room with bright red blood per rectum at home. Patient is being admitted with  #1 lower GI bleed/rectal bleed.Etiology appears diverticular. Patient continues to have rectal bleed in the emergency room will get a stat GI bleeding scan. -hgb 12.7-->8.7-->10.0-->9.6-->8.5-->8.3--.8.5 IV fluids  Dr. Markham JordanELLIOT gastroenterology's in[put appreciated Transfuse as needed. Hold aspirin -GI bleeding scan 07/28/14 negative -GI bleeding scan 07/29/14 positive with distal small bowel area -spoke with Dr Lorretta Harpschneir and recommends to cont med mnx unles she rebleeds to get another GI bleed scan and determine furhter w/u  #2 hypothyroidism continue Synthroid  #3 history of glaucoma Continue eyedrops  #4 DVT prophylaxis teds and SCDs. Avoid antiplatelet due to rectal bleed  Case discussed with Care Management Management plans discussed with the patient, family and they are in agreement.  CODE STATUS: Full DVT Prophylaxis: teds  TOTAL TIME TAKING CARE OF THIS PATIENT: 35 minutes.  >50% time spent on counselling and coordination of care  POSSIBLE D/C IN 1-2 DAYS, DEPENDING ON CLINICAL CONDITION.   Carol Lucero M.D on 07/31/2014 at 11:50 AM  Between 7am to 6pm - Pager - [phone]  After 6pm go to [email or web address] - password [email or web address] [facility]  [facility]  Office  [phone]  CC: Primary care physician; No PCP Per Patient

## 2014-07-31 NOTE — Progress Notes (Signed)
S: No further rectal bleeding no abdominal pain, tolerating liquid diet.  O:  VSS abd soft and nontender HGB 8.5 (stable  A:  GI bleed  Plan  Patient appears stable at this time I do not recommend embolization at this point Plan per Medicine and GI

## 2014-07-31 NOTE — Care Management (Signed)
Important Message  Patient Details  Name: Carol ChiquitoMarianne Lucero MRN: [id number] Date of Birth: 12/19/1926   Medicare Important Message Given:  Yes-third notification given    Olegario MessierKathy A Allmond 07/31/2014, 11:57 AM

## 2014-08-01 LAB — HEMOGLOBIN AND HEMATOCRIT, BLOOD
HCT: 26.1 % — ABNORMAL LOW (ref 35.0–47.0)
Hemoglobin: 8.6 g/dL — ABNORMAL LOW (ref 12.0–16.0)

## 2014-08-01 MED ORDER — POLYETHYLENE GLYCOL 3350 17 G PO PACK
17.0000 g | PACK | Freq: Once | ORAL | Status: DC
  Filled 2014-08-01: qty 1

## 2014-08-01 NOTE — Discharge Summary (Signed)
[facility] - [place] at [facility]   PATIENT NAME: Carol Lucero    MR#:  [id number]  DATE OF BIRTH:  1926/04/10  DATE OF ADMISSION:  07/28/2014 ADMITTING PHYSICIAN: Enedina Finner, MD  DATE OF DISCHARGE: 08/01/14  PRIMARY CARE PHYSICIAN: PCP in [place]  ADMISSION DIAGNOSIS:  GI bleed [K92.2]  DISCHARGE DIAGNOSIS:  Lower GI bleed appears Diverticular  SECONDARY DIAGNOSIS:   Past Medical History  Diagnosis Date  . Thyroid disease   . Coronary artery disease   . Hypothyroidism   . Glaucoma   . Diverticulosis of colon     seen on colonsocpy 5 years ago    HOSPITAL COURSE:   81 year old Caucasian female with past medical history of hypothyroidism, glaucoma, meningioma status post surgery comes to the emergency room with bright red blood per rectum at home. Patient is being admitted with  #1 lower GI bleed/rectal bleed.Etiology appears diverticular. -hgb 12.7-->8.7-->10.0-->9.6-->8.5-->8.3--.8.5-->8.7 receivedIV fluids Dr. Markham Jordan gastroenterology's in[put appreciated Hold aspirin -GI bleeding scan 07/28/14 negative -GI bleeding scan 07/29/14 positive with distal small bowel area -spoke with Dr Lorretta Harp and recommends to cont med mnx  -tolerating FLD  #2 hypothyroidism continue Synthroid  #3 history of glaucoma Continue eyedrops  #4 DVT prophylaxis teds and SCDs. Avoid antiplatelet due to rectal bleed  Overall stable D/c home today Pt ambulating by herself. D/w Drs Gilda Crease and Marva Panda   DISCHARGE CONDITIONS:   fair  CONSULTS OBTAINED:  Treatment Team:  Renford Dills, MD Elliot,Robert  DRUG ALLERGIES:  No Known Allergies  DISCHARGE MEDICATIONS:   Current Discharge Medication List    CONTINUE these medications which have NOT CHANGED   Details  brimonidine (ALPHAGAN) 0.2 % ophthalmic solution Place into both eyes 2 (two) times daily.    dorzolamide-timolol (COSOPT) 22.3-6.8 MG/ML ophthalmic solution Place 1 drop into both  eyes 2 (two) times daily.    levothyroxine (SYNTHROID, LEVOTHROID) 112 MCG tablet Take 112 mcg by mouth daily.    metoprolol succinate (TOPROL-XL) 50 MG 24 hr tablet Take 12.5 mg by mouth daily. Take with or immediately following a meal.    Travoprost, BAK Free, (TRAVATAN) 0.004 % SOLN ophthalmic solution Place 1 drop into both eyes.      STOP taking these medications     aspirin EC 81 MG tablet        If you experience worsening of your admission symptoms, develop shortness of breath, life threatening emergency, suicidal or homicidal thoughts you must seek medical attention immediately by calling 911 or calling your MD immediately  if symptoms less severe.  You Must read complete instructions/literature along with all the possible adverse reactions/side effects for all the Medicines you take and that have been prescribed to you. Take any new Medicines after you have completely understood and accept all the possible adverse reactions/side effects.   Please note  You were cared for by a hospitalist during your hospital stay. If you have any questions about your discharge medications or the care you received while you were in the hospital after you are discharged, you can call the unit and asked to speak with the hospitalist on call if the hospitalist that took care of you is not available. Once you are discharged, your primary care physician will handle any further medical issues. Please note that NO REFILLS for any discharge medications will be authorized once you are discharged, as it is imperative that you return to your primary care physician (or establish a relationship with a primary care physician  if you do not have one) for your aftercare needs so that they can reassess your need for medications and monitor your lab values. Today   SUBJECTIVE   Doing well. No more BM. toelrating diet  VITAL SIGNS:  Blood pressure 93/73, pulse 59, temperature 97.3 F (36.3 C), temperature source  Axillary, resp. rate 16, height 5\' 2"  (1.575 m), weight 63.957 kg (141 lb), SpO2 96 %.  I/O:   Intake/Output Summary (Last 24 hours) at 08/01/14 0954 Last data filed at 08/01/14 0821  Gross per 24 hour  Intake    600 ml  Output   1500 ml  Net   -900 ml    PHYSICAL EXAMINATION:  GENERAL:  81 y.o.-year-old patient lying in the bed with no acute distress.  EYES: Pupils equal, round, reactive to light and accommodation. No scleral icterus. Extraocular muscles intact.  HEENT: Head atraumatic, normocephalic. Oropharynx and nasopharynx clear.  NECK:  Supple, no jugular venous distention. No thyroid enlargement, no tenderness.  LUNGS: Normal breath sounds bilaterally, no wheezing, rales,rhonchi or crepitation. No use of accessory muscles of respiration.  CARDIOVASCULAR: S1, S2 normal. No murmurs, rubs, or gallops.  ABDOMEN: Soft, non-tender, non-distended. Bowel sounds present. No organomegaly or mass.  EXTREMITIES: No pedal edema, cyanosis, or clubbing.  NEUROLOGIC: Cranial nerves II through XII are intact. Muscle strength 5/5 in all extremities. Sensation intact. Gait not checked.  PSYCHIATRIC: The patient is alert and oriented x 3.  SKIN: No obvious rash, lesion, or ulcer.   DATA REVIEW:   CBC   Recent Labs Lab 07/30/14 1448  08/01/14 0928  WBC 6.0  --   --   HGB 8.3*  < > 8.6*  HCT 25.2*  < > 26.1*  PLT 192  --   --   < > = values in this interval not displayed.  Chemistries   Recent Labs Lab 07/28/14 0921  NA 138  K 4.0  CL 102  CO2 25  GLUCOSE 132*  BUN 25*  CREATININE 0.81  CALCIUM 8.9  AST 24  ALT 17  ALKPHOS 68  BILITOT 0.6    Microbiology Results   No results found for this or any previous visit (from the past 240 hour(s)).  RADIOLOGY:  No results found.   Management plans discussed with the patient, family and they are in agreement.  CODE STATUS:     Code Status Orders        Start     Ordered   07/28/14 1303  Full code   Continuous      07/28/14 1302    Advance Directive Documentation        Most Recent Value   Type of Advance Directive  Living will, Healthcare Power of Attorney   Pre-existing out of facility DNR order (yellow form or pink MOST form)     "MOST" Form in Place?        TOTAL TIME TAKING CARE OF THIS PATIENT: 40 minutes.    Makenly Larabee M.D on 08/01/2014 at 9:54 AM  Between 7am to 6pm - Pager - [phone] After 6pm go to [email or web address] - password [email or web address]  [facility]  Office  [phone]  CC: Primary care physician; No PCP Per Patient

## 2014-08-01 NOTE — Progress Notes (Signed)
Notified MD about dark and bright red stool. Okay for pt to be d/c'd.

## 2014-08-01 NOTE — Progress Notes (Signed)
Pt d/c instructions and education given. IV and tele removed. Questions answered. Will be escorted out by staff.

## 2014-08-01 NOTE — Discharge Instructions (Signed)
Full liquid diet for 2 days and then Low residue diet

## 2017-09-16 ENCOUNTER — Observation Stay
Admission: EM | Admit: 2017-09-16 | Discharge: 2017-09-18 | Disposition: A | Discharge status: Home / Self Care | Payer: Medicare Other | Attending: Internal Medicine | Admitting: Internal Medicine

## 2017-09-16 ENCOUNTER — Other Ambulatory Visit: Payer: Self-pay

## 2017-09-16 ENCOUNTER — Encounter: Payer: Self-pay | Admitting: Emergency Medicine

## 2017-09-16 DIAGNOSIS — K5730 Diverticulosis of large intestine without perforation or abscess without bleeding: Secondary | ICD-10-CM | POA: Insufficient documentation

## 2017-09-16 DIAGNOSIS — I2510 Atherosclerotic heart disease of native coronary artery without angina pectoris: Secondary | ICD-10-CM | POA: Insufficient documentation

## 2017-09-16 DIAGNOSIS — Z8673 Personal history of transient ischemic attack (TIA), and cerebral infarction without residual deficits: Secondary | ICD-10-CM | POA: Diagnosis not present

## 2017-09-16 DIAGNOSIS — E039 Hypothyroidism, unspecified: Secondary | ICD-10-CM | POA: Diagnosis not present

## 2017-09-16 DIAGNOSIS — K921 Melena: Secondary | ICD-10-CM | POA: Diagnosis not present

## 2017-09-16 DIAGNOSIS — D62 Acute posthemorrhagic anemia: Secondary | ICD-10-CM | POA: Diagnosis not present

## 2017-09-16 DIAGNOSIS — Z7982 Long term (current) use of aspirin: Secondary | ICD-10-CM | POA: Diagnosis not present

## 2017-09-16 DIAGNOSIS — H409 Unspecified glaucoma: Secondary | ICD-10-CM | POA: Diagnosis not present

## 2017-09-16 DIAGNOSIS — Z8249 Family history of ischemic heart disease and other diseases of the circulatory system: Secondary | ICD-10-CM | POA: Insufficient documentation

## 2017-09-16 DIAGNOSIS — K625 Hemorrhage of anus and rectum: Secondary | ICD-10-CM | POA: Diagnosis present

## 2017-09-16 DIAGNOSIS — Z79899 Other long term (current) drug therapy: Secondary | ICD-10-CM | POA: Insufficient documentation

## 2017-09-16 HISTORY — DX: Cerebral infarction, unspecified: I63.9

## 2017-09-16 LAB — CBC
HCT: 29.4 % — ABNORMAL LOW (ref 35.0–47.0)
Hemoglobin: 10 g/dL — ABNORMAL LOW (ref 12.0–16.0)
MCH: 30.9 pg (ref 26.0–34.0)
MCHC: 34 g/dL (ref 32.0–36.0)
MCV: 91 fL (ref 80.0–100.0)
PLATELETS: 259 10*3/uL (ref 150–440)
RBC: 3.23 MIL/uL — ABNORMAL LOW (ref 3.80–5.20)
RDW: 14 % (ref 11.5–14.5)
WBC: 7.6 10*3/uL (ref 3.6–11.0)

## 2017-09-16 LAB — COMPREHENSIVE METABOLIC PANEL
ALT: 16 U/L (ref 0–44)
AST: 25 U/L (ref 15–41)
Albumin: 3.4 g/dL — ABNORMAL LOW (ref 3.5–5.0)
Alkaline Phosphatase: 75 U/L (ref 38–126)
Anion gap: 10 (ref 5–15)
BILIRUBIN TOTAL: 0.6 mg/dL (ref 0.3–1.2)
BUN: 21 mg/dL (ref 8–23)
CALCIUM: 9 mg/dL (ref 8.9–10.3)
CHLORIDE: 103 mmol/L (ref 98–111)
CO2: 25 mmol/L (ref 22–32)
Creatinine, Ser: 0.67 mg/dL (ref 0.44–1.00)
Glucose, Bld: 119 mg/dL — ABNORMAL HIGH (ref 70–99)
Potassium: 4.1 mmol/L (ref 3.5–5.1)
Sodium: 138 mmol/L (ref 135–145)
Total Protein: 6.4 g/dL — ABNORMAL LOW (ref 6.5–8.1)

## 2017-09-16 MED ORDER — TIMOLOL MALEATE 0.5 % OP SOLN
1.0000 [drp] | Freq: Two times a day (BID) | OPHTHALMIC | Status: DC
  Filled 2017-09-16: qty 5

## 2017-09-16 MED ORDER — SENNOSIDES-DOCUSATE SODIUM 8.6-50 MG PO TABS: 1.0000 | ORAL_TABLET | Freq: Every evening | ORAL | Status: DC | PRN

## 2017-09-16 MED ORDER — ACETAMINOPHEN 650 MG RE SUPP: 650.0000 mg | Freq: Four times a day (QID) | RECTAL | Status: DC | PRN

## 2017-09-16 MED ORDER — BISACODYL 5 MG PO TBEC: 5.0000 mg | DELAYED_RELEASE_TABLET | Freq: Every day | ORAL | Status: DC | PRN

## 2017-09-16 MED ORDER — SODIUM CHLORIDE 0.9 % IV SOLN
INTRAVENOUS | Status: DC
  Administered 2017-09-16 – 2017-09-17 (×2): via INTRAVENOUS

## 2017-09-16 MED ORDER — ONDANSETRON HCL 4 MG/2ML IJ SOLN: 4.0000 mg | Freq: Four times a day (QID) | INTRAMUSCULAR | Status: DC | PRN

## 2017-09-16 MED ORDER — THYROID 60 MG PO TABS
65.0000 mg | ORAL_TABLET | Freq: Every day | ORAL | Status: DC
  Administered 2017-09-17: 60 mg via ORAL
  Filled 2017-09-16 (×3): qty 1

## 2017-09-16 MED ORDER — ALBUTEROL SULFATE (2.5 MG/3ML) 0.083% IN NEBU: 2.5000 mg | INHALATION_SOLUTION | RESPIRATORY_TRACT | Status: DC | PRN

## 2017-09-16 MED ORDER — DORZOLAMIDE HCL-TIMOLOL MAL 2-0.5 % OP SOLN: 1.0000 [drp] | Freq: Two times a day (BID) | OPHTHALMIC | Status: DC

## 2017-09-16 MED ORDER — ACETAMINOPHEN 325 MG PO TABS: 650.0000 mg | ORAL_TABLET | Freq: Four times a day (QID) | ORAL | Status: DC | PRN

## 2017-09-16 MED ORDER — ONDANSETRON HCL 4 MG PO TABS: 4.0000 mg | ORAL_TABLET | Freq: Four times a day (QID) | ORAL | Status: DC | PRN

## 2017-09-16 MED ORDER — BRIMONIDINE TARTRATE 0.2 % OP SOLN
1.0000 [drp] | Freq: Two times a day (BID) | OPHTHALMIC | Status: DC
  Filled 2017-09-16: qty 5

## 2017-09-16 MED ORDER — HYDROCODONE-ACETAMINOPHEN 5-325 MG PO TABS: 1.0000 | ORAL_TABLET | ORAL | Status: DC | PRN

## 2017-09-16 MED ORDER — DORZOLAMIDE HCL 2 % OP SOLN
1.0000 [drp] | Freq: Two times a day (BID) | OPHTHALMIC | Status: DC
  Administered 2017-09-17: 1 [drp] via OPHTHALMIC
  Filled 2017-09-16: qty 10

## 2017-09-16 NOTE — ED Notes (Signed)
MD at bedside. 

## 2017-09-16 NOTE — ED Notes (Signed)
Jessica RN, aware of bed assigned  

## 2017-09-16 NOTE — H&P (Signed)
[facility] - [place] at [facility]   PATIENT NAME: Carol Lucero    MR#:  [id number]  DATE OF BIRTH:  12/19/1926  DATE OF ADMISSION:  09/16/2017  PRIMARY CARE PHYSICIAN: Jennelle HumanGodly, Kathryn M, PA   REQUESTING/REFERRING PHYSICIAN: Dr. Pershing ProudSchaevitz.  CHIEF COMPLAINT:   Chief Complaint  Patient presents with  . Rectal Bleeding   Rectal bleeding since last night. HISTORY OF PRESENT ILLNESS:  Carol Lucero  is a 84 y.o. female with a known history of CAD, CVA, diverticulosis of colon, hypothyroidism, glaucoma.  The patient presents the ED with above chief complaints.  The patient has had rectal bleeding several times since last night.  Hemoglobin decreased to 10 from 11.  The patient has a history of GI bleeding in 2016, so aspirin was discontinued.  But the patient had CVA this April and put back on aspirin 325 mg daily.  PAST MEDICAL HISTORY:   Past Medical History:  Diagnosis Date  . Coronary artery disease   . Diverticulosis of colon    seen on colonsocpy 5 years ago  . Glaucoma   . Hypothyroidism   . Stroke (HCC)   . Thyroid disease     PAST SURGICAL HISTORY:   Past Surgical History:  Procedure Laterality Date  . CORONARY ANGIOPLASTY    . uterine polypectomy N/A     SOCIAL HISTORY:   Social History   Tobacco Use  . Smoking status: Never Smoker  . Smokeless tobacco: Never Used  Substance Use Topics  . Alcohol use: No    FAMILY HISTORY:   Family History  Problem Relation Age of Onset  . CAD Mother   . Hypertension Mother   . CAD Father     DRUG ALLERGIES:  No Known Allergies  REVIEW OF SYSTEMS:   Review of Systems  Constitutional: Positive for malaise/fatigue. Negative for chills and fever.  HENT: Negative for sore throat.   Eyes: Negative for blurred vision and double vision.  Respiratory: Negative for cough, hemoptysis, shortness of breath, wheezing and stridor.   Cardiovascular: Negative for chest pain, palpitations,  orthopnea and leg swelling.  Gastrointestinal: Positive for blood in stool. Negative for abdominal pain, diarrhea, melena, nausea and vomiting.  Genitourinary: Negative for dysuria, flank pain and hematuria.  Musculoskeletal: Negative for back pain and joint pain.  Skin: Negative for rash.  Neurological: Negative for dizziness, sensory change, focal weakness, seizures, loss of consciousness, weakness and headaches.  Endo/Heme/Allergies: Negative for polydipsia.  Psychiatric/Behavioral: Negative for depression. The patient is not nervous/anxious.     MEDICATIONS AT HOME:   Prior to Admission medications   Medication Sig Start Date End Date Taking? Authorizing Provider  brimonidine (ALPHAGAN) 0.2 % ophthalmic solution Place 1 drop into both eyes 2 (two) times daily.    Yes [provider]  dorzolamide-timolol (COSOPT) 22.3-6.8 MG/ML ophthalmic solution Place 1 drop into both eyes 2 (two) times daily.   Yes [provider]  liothyronine (CYTOMEL) 5 MCG tablet Take 10 mcg by mouth daily. 08/15/17  Yes [provider]  NALTREXONE HCL PO Take 2 mg by mouth daily. 08/01/17  Yes [provider]  NATURE-THROID 65 MG tablet Take 1 tablet by mouth daily. 09/14/17  Yes [provider]  Travoprost, BAK Free, (TRAVATAN) 0.004 % SOLN ophthalmic solution Place 1 drop into both eyes at bedtime.    Yes [provider]      VITAL SIGNS:  Blood pressure (!) 144/70, pulse 86, temperature 98.5 F (36.9 C),  temperature source Oral, resp. rate 15, height 5\' 2"  (1.575 m), weight 45.4 kg, SpO2 100 %.  PHYSICAL EXAMINATION:  Physical Exam  GENERAL:  84 y.o.-year-old patient lying in the bed with no acute distress.  EYES: Pupils equal, round, reactive to light and accommodation. No scleral icterus. Extraocular muscles intact.  HEENT: Head atraumatic, normocephalic. Oropharynx and nasopharynx clear.  NECK:  Supple, no jugular venous distention. No thyroid  enlargement, no tenderness.  LUNGS: Normal breath sounds bilaterally, no wheezing, rales,rhonchi or crepitation. No use of accessory muscles of respiration.  CARDIOVASCULAR: S1, S2 normal. No murmurs, rubs, or gallops.  ABDOMEN: Soft, nontender, nondistended. Bowel sounds present. No organomegaly or mass.  EXTREMITIES: No pedal edema, cyanosis, or clubbing.  NEUROLOGIC: Cranial nerves II through XII are intact. Muscle strength 5/5 in all extremities. Sensation intact. Gait not checked.  PSYCHIATRIC: The patient is alert and oriented x 3.  SKIN: No obvious rash, lesion, or ulcer.   LABORATORY PANEL:   CBC Recent Labs  Lab 09/16/17 1429  WBC 7.6  HGB 10.0*  HCT 29.4*  PLT 259   ------------------------------------------------------------------------------------------------------------------  Chemistries  Recent Labs  Lab 09/16/17 1429  NA 138  K 4.1  CL 103  CO2 25  GLUCOSE 119*  BUN 21  CREATININE 0.67  CALCIUM 9.0  AST 25  ALT 16  ALKPHOS 75  BILITOT 0.6   ------------------------------------------------------------------------------------------------------------------  Cardiac Enzymes No results for input(s): TROPONINI in the last 168 hours. ------------------------------------------------------------------------------------------------------------------  RADIOLOGY:  No results found.    IMPRESSION AND PLAN:   Rectal bleeding. The patient will be placed for observation.  Hold aspirin, clear liquid diet for now, IV fluid support, n.p.o. after midnight for possible procedure.  GI consult.  Anemia of chronic disease and acute blood loss due to GI bleeding. Follow-up hemoglobin, PRBC transfusion PRN.  CAD and recent CVA.  Hold aspirin.  All the records are reviewed and case discussed with ED provider. Management plans discussed with the patient, her daughter and they are in agreement.  CODE STATUS: Full code  TOTAL TIME TAKING CARE OF THIS PATIENT: 40  minutes.    Shaune PollackQing Arav Bannister M.D on 09/16/2017 at 5:51 PM  Between 7am to 6pm - Pager - [phone]  After 6pm go to [email or web address] - password [email or web address] [facility]  [facility] [place] [facility]  Office  [phone]  CC: Primary care physician; Jennelle HumanGodly, Kathryn M, PA   Note: This dictation was prepared with Dragon dictation along with smaller phrase technology. Any transcriptional errors that result from this process are unin

## 2017-09-16 NOTE — ED Provider Notes (Signed)
[facility] Emergency Department Provider Note ____________________________________________   First MD Initiated Contact with Patient 09/16/17 1706     (approximate)  I have reviewed the triage vital signs and the nursing notes.   HISTORY  Chief Complaint Rectal Bleeding  HPI Carol ChiquitoMarianne Pluta is a 84 y.o. female with a history of diverticular bleeding in 2016 was presenting with blood per rectum over the past 24 hours.  She says that she has had 8 bloody stools over the past 24 hours and usually has one normal stool per day.  Says that she has not been on any anticoagulants.  However, was restarted on anticoagulants this past April after her stroke.  She is taking 325 mg of daily aspirin.  Denies any pain at this time.  Does not report any lightheadedness or dizziness.   Past Medical History:  Diagnosis Date  . Coronary artery disease   . Diverticulosis of colon    seen on colonsocpy 5 years ago  . Glaucoma   . Hypothyroidism   . Stroke (HCC)   . Thyroid disease     Patient Active Problem List   Diagnosis Date Noted  . Rectal bleed 07/28/2014    Past Surgical History:  Procedure Laterality Date  . CORONARY ANGIOPLASTY    . uterine polypectomy N/A     Prior to Admission medications   Medication Sig Start Date End Date Taking? Authorizing Provider  brimonidine (ALPHAGAN) 0.2 % ophthalmic solution Place 1 drop into both eyes 2 (two) times daily.    Yes [provider]  dorzolamide-timolol (COSOPT) 22.3-6.8 MG/ML ophthalmic solution Place 1 drop into both eyes 2 (two) times daily.   Yes [provider]  liothyronine (CYTOMEL) 5 MCG tablet Take 10 mcg by mouth daily. 08/15/17  Yes [provider]  NALTREXONE HCL PO Take 2 mg by mouth daily. 08/01/17  Yes [provider]  NATURE-THROID 65 MG tablet Take 1 tablet by mouth daily. 09/14/17  Yes [provider]  Travoprost, BAK Free, (TRAVATAN) 0.004 % SOLN  ophthalmic solution Place 1 drop into both eyes at bedtime.    Yes [provider]    Allergies Patient has no known allergies.  Family History  Problem Relation Age of Onset  . CAD Mother   . Hypertension Mother   . CAD Father     Social History Social History   Tobacco Use  . Smoking status: Never Smoker  . Smokeless tobacco: Never Used  Substance Use Topics  . Alcohol use: No  . Drug use: Never    Review of Systems  Constitutional: No fever/chills Eyes: No visual changes. ENT: No sore throat. Cardiovascular: Denies chest pain. Respiratory: Denies shortness of breath. Gastrointestinal: No abdominal pain.  No nausea, no vomiting.  No constipation. Genitourinary: Negative for dysuria. Musculoskeletal: Negative for back pain. Skin: Negative for rash. Neurological: Negative for headaches, focal weakness or numbness.   ____________________________________________   PHYSICAL EXAM:  VITAL SIGNS: ED Triage Vitals  Enc Vitals Group     BP 09/16/17 1420 (!) 122/57     Pulse Rate 09/16/17 1420 94     Resp 09/16/17 1420 16     Temp 09/16/17 1420 98.5 F (36.9 C)     Temp Source 09/16/17 1420 Oral     SpO2 09/16/17 1420 98 %     Weight 09/16/17 1421 100 lb (45.4 kg)     Height 09/16/17 1421 5\' 2"  (1.575 m)     Head Circumference --  Peak Flow --      Pain Score 09/16/17 1420 0     Pain Loc --      Pain Edu? --      Excl. in GC? --     Constitutional: Alert and oriented. Well appearing and in no acute distress. Eyes: Conjunctivae are normal.  Head: Atraumatic. Nose: No congestion/rhinnorhea. Mouth/Throat: Mucous membranes are moist.  Neck: No stridor.   Cardiovascular: Normal rate, regular rhythm. Grossly normal heart sounds.   Respiratory: Normal respiratory effort.  No retractions. Lungs CTAB. Gastrointestinal: Soft and nontender. No distention.  Digital rectal exam with hematochezia on the glove. Musculoskeletal: No lower extremity  tenderness nor edema.  No joint effusions. Neurologic:  Normal speech and language. No gross focal neurologic deficits are appreciated. Skin:  Skin is warm, dry and intact. No rash noted. Psychiatric: Mood and affect are normal. Speech and behavior are normal.  ____________________________________________   LABS (all labs ordered are listed, but only abnormal results are displayed)  Labs Reviewed  COMPREHENSIVE METABOLIC PANEL - Abnormal; Notable for the following components:      Result Value   Glucose, Bld 119 (*)    Total Protein 6.4 (*)    Albumin 3.4 (*)    All other components within normal limits  CBC - Abnormal; Notable for the following components:   RBC 3.23 (*)    Hemoglobin 10.0 (*)    HCT 29.4 (*)    All other components within normal limits  POC OCCULT BLOOD, ED  TYPE AND SCREEN   ____________________________________________  EKG   ____________________________________________  RADIOLOGY   ____________________________________________   PROCEDURES  Procedure(s) performed:   Procedures  Critical Care performed:   ____________________________________________   INITIAL IMPRESSION / ASSESSMENT AND PLAN / ED COURSE  Pertinent labs & imaging results that were available during my care of the patient were reviewed by me and considered in my medical decision making (see chart for details).  DDX: Diverticular bleed, upper GI bleed, lower GI bleed, internal hemorrhoid bleeding, anemia As part of my medical decision making, I reviewed the following data within the electronic MEDICAL RECORD NUMBER Notes from prior ED visits  ----------------------------------------- 5:37 PM on 09/16/2017 -----------------------------------------  Patient's hemoglobin appears to be down to 10 from 11.9 [facility].  To be admitted to the hospital.  Signed out to Dr. Johny Drillinghan.  Patient and family aware of diagnosis as well as treatment plan willing to  comply. ____________________________________________   FINAL CLINICAL IMPRESSION(S) / ED DIAGNOSES  GI bleeding.  NEW MEDICATIONS STARTED DURING THIS VISIT:  New Prescriptions   No medications on file     Note:  This document was prepared using Dragon voice recognition software and may include unintentional dictation errors.     Myrna BlazerSchaevitz, David Matthew, MD 09/16/17 [phone]

## 2017-09-16 NOTE — ED Triage Notes (Addendum)
PT to ED from home with daughter, pt states she has noticed bright red blood in her stool since yesterday. States some increased weakness. Pt denies any pain. VSS . Color WDL , NAD  Noted

## 2017-09-17 DIAGNOSIS — K625 Hemorrhage of anus and rectum: Secondary | ICD-10-CM

## 2017-09-17 DIAGNOSIS — K921 Melena: Secondary | ICD-10-CM | POA: Diagnosis not present

## 2017-09-17 LAB — HEMOGLOBIN: HEMOGLOBIN: 8.7 g/dL — AB (ref 12.0–16.0)

## 2017-09-17 LAB — CBC
HCT: 28.2 % — ABNORMAL LOW (ref 35.0–47.0)
Hemoglobin: 9.7 g/dL — ABNORMAL LOW (ref 12.0–16.0)
MCH: 31.5 pg (ref 26.0–34.0)
MCHC: 34.4 g/dL (ref 32.0–36.0)
MCV: 91.6 fL (ref 80.0–100.0)
PLATELETS: 228 10*3/uL (ref 150–440)
RBC: 3.08 MIL/uL — ABNORMAL LOW (ref 3.80–5.20)
RDW: 13.9 % (ref 11.5–14.5)
WBC: 5.5 10*3/uL (ref 3.6–11.0)

## 2017-09-17 LAB — BASIC METABOLIC PANEL
ANION GAP: 7 (ref 5–15)
BUN: 19 mg/dL (ref 8–23)
CHLORIDE: 108 mmol/L (ref 98–111)
CO2: 28 mmol/L (ref 22–32)
CREATININE: 0.6 mg/dL (ref 0.44–1.00)
Calcium: 8.8 mg/dL — ABNORMAL LOW (ref 8.9–10.3)
GFR calc non Af Amer: >60 mL/min (ref >60)
Glucose, Bld: 106 mg/dL — ABNORMAL HIGH (ref 70–99)
Potassium: 3.9 mmol/L (ref 3.5–5.1)
Sodium: 143 mmol/L (ref 135–145)

## 2017-09-17 NOTE — Progress Notes (Signed)
[facility] - [place] at [facility]   PATIENT NAME: Carol Lucero    MR#:  [id number]  DATE OF BIRTH:  03/07/1926  SUBJECTIVE:  CHIEF COMPLAINT:   Chief Complaint  Patient presents with  . Rectal Bleeding   Bloody stools this afternoon. REVIEW OF SYSTEMS:  Review of Systems  Constitutional: Negative for chills, fever and malaise/fatigue.  HENT: Negative for sore throat.   Eyes: Negative for blurred vision and double vision.  Respiratory: Negative for cough, hemoptysis, shortness of breath, wheezing and stridor.   Cardiovascular: Negative for chest pain, palpitations, orthopnea and leg swelling.  Gastrointestinal: Positive for blood in stool. Negative for abdominal pain, diarrhea, melena, nausea and vomiting.  Genitourinary: Negative for dysuria, flank pain and hematuria.  Musculoskeletal: Negative for back pain and joint pain.  Skin: Negative for rash.  Neurological: Negative for dizziness, sensory change, focal weakness, seizures, loss of consciousness, weakness and headaches.  Endo/Heme/Allergies: Negative for polydipsia.  Psychiatric/Behavioral: Negative for depression. The patient is not nervous/anxious.     DRUG ALLERGIES:  No Known Allergies VITALS:  Blood pressure (!) 142/59, pulse 66, temperature 97.7 F (36.5 C), temperature source Oral, resp. rate 18, height 5\' 2"  (1.575 m), weight 45.4 kg, SpO2 99 %. PHYSICAL EXAMINATION:  Physical Exam  Constitutional: She is oriented to person, place, and time. No distress.  HENT:  Head: Normocephalic.  Mouth/Throat: Oropharynx is clear and moist.  Eyes: Pupils are equal, round, and reactive to light. Conjunctivae and EOM are normal. No scleral icterus.  Neck: Normal range of motion. Neck supple. No JVD present. No tracheal deviation present.  Cardiovascular: Normal rate, regular rhythm and normal heart sounds. Exam reveals no gallop.  No murmur heard. Pulmonary/Chest: Effort normal and breath sounds  normal. No respiratory distress. She has no wheezes. She has no rales.  Abdominal: Soft. Bowel sounds are normal. She exhibits no distension. There is no tenderness. There is no rebound.  Musculoskeletal: Normal range of motion. She exhibits no edema or tenderness.  Neurological: She is alert and oriented to person, place, and time. No cranial nerve deficit.  Skin: No rash noted. No erythema.  Psychiatric: She has a normal mood and affect.   LABORATORY PANEL:  Female CBC Recent Labs  Lab 09/17/17 0442  WBC 5.5  HGB 9.7*  HCT 28.2*  PLT 228   ------------------------------------------------------------------------------------------------------------------ Chemistries  Recent Labs  Lab 09/16/17 1429 09/17/17 0442  NA 138 143  K 4.1 3.9  CL 103 108  CO2 25 28  GLUCOSE 119* 106*  BUN 21 19  CREATININE 0.67 0.60  CALCIUM 9.0 8.8*  AST 25  --   ALT 16  --   ALKPHOS 75  --   BILITOT 0.6  --    RADIOLOGY:  No results found. ASSESSMENT AND PLAN:   Rectal bleeding. Hold aspirin, clear liquid diet, Suggested a colonoscopy which she refused per Dr. Tobi BastosAnna.  Anemia of chronic disease and acute blood loss due to GI bleeding. Hemoglobin 9.7, but still has bloody stool, follow-up hemoglobin tonight and in a.m., PRBC transfusion PRN.  CAD and recent CVA.  Hold aspirin. All the records are reviewed and case discussed with Care Management/Social Worker. Management plans discussed with the patient, her daughter and they are in agreement.  CODE STATUS: Full Code  TOTAL TIME TAKING CARE OF THIS PATIENT: 30 minutes.   More than 50% of the time was spent in counseling/coordination of care: YES  POSSIBLE D/C IN 1 DAYS, DEPENDING ON CLINICAL  CONDITION.   Shaune PollackQing Anjulie Dipierro M.D on 09/17/2017 at 4:51 PM  Between 7am to 6pm - Pager - [phone]  After 6pm go to [email or web address] - Therapist, nutritionalpassword [email or web address]  [facility] [place] [facility]

## 2017-09-17 NOTE — Consult Note (Signed)
Carol Lucero , MD [address], [address], [place], [place], [id number] [address], [address], [place], [place], [id number] Phone: [phone]  Fax: [phone]  Consultation  Referring Provider:   DR Carol Lucero  Primary Care Physician:  Carol Lucero, Carol M, [place] Primary Gastroenterologist:      None     Reason for Consultation:     Rectal bleeding   Date of Admission:  09/16/2017 Date of Consultation:  09/17/2017         HPI:   Carol Lucero is a 84 y.o. female with past medical history of coronary artery disease, cerebrovascular disease, diverticulosis of the colon, glaucoma.  She presented to the emergency room yesterday with rectal bleeding.  She is on aspirin 325 mg daily.  In 2016 her CBC showed she had a hemoglobin of 8.3 g.  On admission her hemoglobin was 10 g yesterday and 9.7 g today.  I do note a tagged RBC scan performed in June 2016 showed active bleeding in the right lower quadrant concerning for distal small bowel bleed.  She was seen by vascular surgery at that point of time.  I do not note any GI procedures on epic nor do I find any vascular procedures on epic   She says that all of a sudden yesterday , she had painless , bloody stools with bright red blood,occured a few times and stopped. None since yesterday . She says that a similar episode happened in 2016 and did not have a colonoscopy as she was "too old" . Presently no new complaints   Past Medical History:  Diagnosis Date  . Coronary artery disease   . Diverticulosis of colon    seen on colonsocpy 5 years ago  . Glaucoma   . Hypothyroidism   . Stroke (HCC)   . Thyroid disease     Past Surgical History:  Procedure Laterality Date  . CORONARY ANGIOPLASTY    . uterine polypectomy N/A     Prior to Admission medications   Medication Sig Start Date End Date Taking? Authorizing Provider  brimonidine (ALPHAGAN) 0.2 % ophthalmic solution Place 1 drop into both eyes 2 (two) times daily.    Yes [provider]  dorzolamide-timolol (COSOPT) 22.3-6.8 MG/ML ophthalmic solution Place 1 drop into both eyes 2 (two) times daily.   Yes [provider]  liothyronine (CYTOMEL) 5 MCG tablet Take 10 mcg by mouth daily. 08/15/17  Yes [provider]  NALTREXONE HCL PO Take 2 mg by mouth daily. 08/01/17  Yes [provider]  NATURE-THROID 65 MG tablet Take 1 tablet by mouth daily. 09/14/17  Yes [provider]  Travoprost, BAK Free, (TRAVATAN) 0.004 % SOLN ophthalmic solution Place 1 drop into both eyes at bedtime.    Yes [provider]    Family History  Problem Relation Age of Onset  . CAD Mother   . Hypertension Mother   . CAD Father      Social History   Tobacco Use  . Smoking status: Never Smoker  . Smokeless tobacco: Never Used  Substance Use Topics  . Alcohol use: No  . Drug use: Never    Allergies as of 09/16/2017  . (No Known Allergies)    Review of Systems:    All systems reviewed and negative except where noted in HPI.   Physical Exam:  Vital signs in last 24 hours: Temp:  [97.7 F (36.5 C)-98.5 F (36.9 C)] 97.7 F (36.5 C) (08/17 0416) Pulse Rate:  [  66-94] 66 (08/17 0416) Resp:  [15-18] 18 (08/17 0416) BP: (122-148)/(57-71) 142/59 (08/17 0416) SpO2:  [98 %-100 %] 99 % (08/17 0416) Weight:  [45.4 kg] 45.4 kg (08/16 1421) Last BM Date: 09/16/17 General:   Pleasant, cooperative in NAD Head:  Normocephalic and atraumatic. Eyes:   No icterus.   Conjunctiva pink. PERRLA. Ears:  Normal auditory acuity. Neck:  Supple; no masses or thyroidomegaly Lungs: Respirations even and unlabored. Lungs clear to auscultation bilaterally.   No wheezes, crackles, or rhonchi.  Heart:  Regular rate and rhythm;  Without murmur, clicks, rubs or gallops Abdomen:  Soft, nondistended, nontender. Normal bowel sounds. No appreciable masses or hepatomegaly.  No rebound or guarding.  Neurologic:  Alert and oriented x3;  grossly normal neurologically. Skin:   Intact without significant lesions or rashes. Cervical Nodes:  No significant cervical adenopathy. Psych:  Alert and cooperative. Normal affect.  LAB RESULTS: Recent Labs    09/16/17 1429 09/17/17 0442  WBC 7.6 5.5  HGB 10.0* 9.7*  HCT 29.4* 28.2*  PLT 259 228   BMET Recent Labs    09/16/17 1429 09/17/17 0442  NA 138 143  K 4.1 3.9  CL 103 108  CO2 25 28  GLUCOSE 119* 106*  BUN 21 19  CREATININE 0.67 0.60  CALCIUM 9.0 8.8*   LFT Recent Labs    09/16/17 1429  PROT 6.4*  ALBUMIN 3.4*  AST 25  ALT 16  ALKPHOS 75  BILITOT 0.6   PT/INR No results for input(s): LABPROT, INR in the last 72 hours.  STUDIES: No results found.    Impression / Plan:   Carol Lucero is a 84 y.o. y/o female with rectal bleeding.  Hemoglobin has been stable.Likely diverticular but can neoplasm is also a possibility. Suggested a colonoscopy which she refused. Suggested to follow up with PCP and if she changes her mind to call our office.     I will sign off.  Please call me if any further GI concerns or questions.  We would like to thank you for the opportunity to participate in the care of Carol Lucero.   Thank you for involving me in the care of this patient.      LOS: 0 days   Carol MoodKiran Rashena Dowling, MD  09/17/2017, 9:08 AM

## 2017-09-17 NOTE — Care Management Obs Status (Signed)
MEDICARE OBSERVATION STATUS NOTIFICATION   Patient Details  Name: Carol Lucero MRN: [id number] Date of Birth: 04/20/1926   Medicare Observation Status Notification Given:  Yes    Karn Derk A Fulton Merry, RN 09/17/2017, 9:04 AM

## 2017-09-18 DIAGNOSIS — K921 Melena: Secondary | ICD-10-CM | POA: Diagnosis not present

## 2017-09-18 LAB — HEMOGLOBIN: Hemoglobin: 8.9 g/dL — ABNORMAL LOW (ref 12.0–16.0)

## 2017-09-18 NOTE — Progress Notes (Signed)
09/18/2017 10:42 AM  Richardson ChiquitoMarianne Kim to be D/C'd Home per MD order.  Discussed prescriptions and follow up appointments with the patient. Prescriptions given to patient, medication list explained in detail. Pt verbalized understanding.  Allergies as of 09/18/2017   No Known Allergies     Medication List    TAKE these medications   brimonidine 0.2 % ophthalmic solution Commonly known as:  ALPHAGAN Place 1 drop into both eyes 2 (two) times daily.   dorzolamide-timolol 22.3-6.8 MG/ML ophthalmic solution Commonly known as:  COSOPT Place 1 drop into both eyes 2 (two) times daily.   liothyronine 5 MCG tablet Commonly known as:  CYTOMEL Take 10 mcg by mouth daily.   NALTREXONE HCL PO Take 2 mg by mouth daily.   NATURE-THROID 65 MG tablet Generic drug:  thyroid Take 1 tablet by mouth daily.   Travoprost (BAK Free) 0.004 % Soln ophthalmic solution Commonly known as:  TRAVATAN Place 1 drop into both eyes at bedtime.       Vitals:   09/17/17 2008 09/18/17 0449  BP: (!) 146/63 116/71  Pulse: 81 63  Resp: 20 18  Temp: (!) 97.3 F (36.3 C) (!) 97.4 F (36.3 C)  SpO2: 98% 97%    Skin clean, dry and intact without evidence of skin break down, no evidence of skin tears noted. IV catheter discontinued intact. Site without signs and symptoms of complications. Dressing and pressure applied. Pt denies pain at this time. No complaints noted.  An After Visit Summary was printed and given to the patient. Patient escorted via WC, and D/C home via private auto.  Bradly Chrisougherty, [place] Magnan E

## 2017-09-18 NOTE — Discharge Summary (Signed)
[facility] -  at [facility]   PATIENT NAME: Carol ChiquitoMarianne Lucero    MR#:  [id number]  DATE OF BIRTH:  03/08/1926  DATE OF ADMISSION:  09/16/2017   ADMITTING PHYSICIAN: Shaune PollackQing Dayanara Sherrill, MD  DATE OF DISCHARGE:  09/18/2017  PRIMARY CARE PHYSICIAN: Jennelle HumanGodly, Kathryn M, PA   ADMISSION DIAGNOSIS:  Rectal bleeding [K62.5] DISCHARGE DIAGNOSIS:  Active Problems:   Rectal bleeding  SECONDARY DIAGNOSIS:   Past Medical History:  Diagnosis Date  . Coronary artery disease   . Diverticulosis of colon    seen on colonsocpy 5 years ago  . Glaucoma   . Hypothyroidism   . Stroke (HCC)   . Thyroid disease    HOSPITAL COURSE:  Rectal bleeding. Hold aspirin, clear liquid diet, Suggested a colonoscopy which she refused per Dr. Tobi BastosAnna.  Anemia of chronic disease and acute blood loss due to GI bleeding. Hemoglobin decreased to 8.9, no bloody stool overniht, follow-up hemoglobin with PCP. Follow up GI as outpatient. CAD and recent CVA. Hold aspirin.  DISCHARGE CONDITIONS:  Stable, discharge to home today. CONSULTS OBTAINED:   DRUG ALLERGIES:  No Known Allergies DISCHARGE MEDICATIONS:   Allergies as of 09/18/2017   No Known Allergies     Medication List    TAKE these medications   brimonidine 0.2 % ophthalmic solution Commonly known as:  ALPHAGAN Place 1 drop into both eyes 2 (two) times daily.   dorzolamide-timolol 22.3-6.8 MG/ML ophthalmic solution Commonly known as:  COSOPT Place 1 drop into both eyes 2 (two) times daily.   liothyronine 5 MCG tablet Commonly known as:  CYTOMEL Take 10 mcg by mouth daily.   NALTREXONE HCL PO Take 2 mg by mouth daily.   NATURE-THROID 65 MG tablet Generic drug:  thyroid Take 1 tablet by mouth daily.   Travoprost (BAK Free) 0.004 % Soln ophthalmic solution Commonly known as:  TRAVATAN Place 1 drop into both eyes at bedtime.        DISCHARGE INSTRUCTIONS:  See AVS. If you experience worsening of your admission  symptoms, develop shortness of breath, life threatening emergency, suicidal or homicidal thoughts you must seek medical attention immediately by calling 911 or calling your MD immediately  if symptoms less severe.  You Must read complete instructions/literature along with all the possible adverse reactions/side effects for all the Medicines you take and that have been prescribed to you. Take any new Medicines after you have completely understood and accpet all the possible adverse reactions/side effects.   Please note  You were cared for by a hospitalist during your hospital stay. If you have any questions about your discharge medications or the care you received while you were in the hospital after you are discharged, you can call the unit and asked to speak with the hospitalist on call if the hospitalist that took care of you is not available. Once you are discharged, your primary care physician will handle any further medical issues. Please note that NO REFILLS for any discharge medications will be authorized once you are discharged, as it is imperative that you return to your primary care physician (or establish a relationship with a primary care physician if you do not have one) for your aftercare needs so that they can reassess your need for medications and monitor your lab values.    On the day of Discharge:  VITAL SIGNS:  Blood pressure 116/71, pulse 63, temperature (!) 97.4 F (36.3 C), temperature source Oral, resp. rate 18, height 5\' 2"  (1.575  m), weight 45.4 kg, SpO2 97 %. PHYSICAL EXAMINATION:  GENERAL:  84 y.o.-year-old patient lying in the bed with no acute distress.  EYES: Pupils equal, round, reactive to light and accommodation. No scleral icterus. Extraocular muscles intact.  HEENT: Head atraumatic, normocephalic. Oropharynx and nasopharynx clear.  NECK:  Supple, no jugular venous distention. No thyroid enlargement, no tenderness.  LUNGS: Normal breath sounds bilaterally, no  wheezing, rales,rhonchi or crepitation. No use of accessory muscles of respiration.  CARDIOVASCULAR: S1, S2 normal. No murmurs, rubs, or gallops.  ABDOMEN: Soft, non-tender, non-distended. Bowel sounds present. No organomegaly or mass.  EXTREMITIES: No pedal edema, cyanosis, or clubbing.  NEUROLOGIC: Cranial nerves II through XII are intact. Muscle strength 5/5 in all extremities. Sensation intact. Gait not checked.  PSYCHIATRIC: The patient is alert and oriented x 3.  SKIN: No obvious rash, lesion, or ulcer.  DATA REVIEW:   CBC Recent Labs  Lab 09/17/17 0442  09/18/17 0521  WBC 5.5  --   --   HGB 9.7*   < > 8.9*  HCT 28.2*  --   --   PLT 228  --   --    < > = values in this interval not displayed.    Chemistries  Recent Labs  Lab 09/16/17 1429 09/17/17 0442  NA 138 143  K 4.1 3.9  CL 103 108  CO2 25 28  GLUCOSE 119* 106*  BUN 21 19  CREATININE 0.67 0.60  CALCIUM 9.0 8.8*  AST 25  --   ALT 16  --   ALKPHOS 75  --   BILITOT 0.6  --      Microbiology Results  No results found for this or any previous visit.  RADIOLOGY:  No results found.   Management plans discussed with the patient, family and they are in agreement.  CODE STATUS: Full Code   TOTAL TIME TAKING CARE OF THIS PATIENT: 26 minutes.    Shaune PollackQing Shalen Petrak M.D on 09/18/2017 at 9:29 AM  Between 7am to 6pm - Pager - [phone]  After 6pm go to [email or web address] - password [email or web address] [facility]  [facility]  Hospitalists  Office  [phone]  CC: Primary care physician; Jennelle HumanGodly, Kathryn M, PA   Note: This dictation was prepared with Dragon dictation along with smaller phrase technology. Any transcriptional errors that result from this process are unintentional.

## 2017-09-19 LAB — BPAM RBC
BLOOD PRODUCT EXPIRATION DATE: 201909072359
Blood Product Expiration Date: 201909072359
Unit Type and Rh: 5100
Unit Type and Rh: 5100

## 2017-09-19 LAB — TYPE AND SCREEN
ABO/RH(D): O POS
Antibody Screen: POSITIVE
Unit division: 0
Unit division: 0

## 2018-11-24 ENCOUNTER — Other Ambulatory Visit: Payer: Self-pay

## 2018-11-24 ENCOUNTER — Emergency Department
Admission: EM | Admit: 2018-11-24 | Discharge: 2018-11-25 | Disposition: A | Discharge status: Home / Self Care | Attending: Emergency Medicine | Admitting: Emergency Medicine

## 2018-11-24 ENCOUNTER — Emergency Department

## 2018-11-24 DIAGNOSIS — W19XXXA Unspecified fall, initial encounter: Secondary | ICD-10-CM | POA: Diagnosis not present

## 2018-11-24 DIAGNOSIS — Y929 Unspecified place or not applicable: Secondary | ICD-10-CM | POA: Diagnosis not present

## 2018-11-24 DIAGNOSIS — S42294A Other nondisplaced fracture of upper end of right humerus, initial encounter for closed fracture: Secondary | ICD-10-CM | POA: Insufficient documentation

## 2018-11-24 DIAGNOSIS — I2510 Atherosclerotic heart disease of native coronary artery without angina pectoris: Secondary | ICD-10-CM | POA: Insufficient documentation

## 2018-11-24 DIAGNOSIS — Z79899 Other long term (current) drug therapy: Secondary | ICD-10-CM | POA: Diagnosis not present

## 2018-11-24 DIAGNOSIS — Y999 Unspecified external cause status: Secondary | ICD-10-CM | POA: Insufficient documentation

## 2018-11-24 DIAGNOSIS — Z8673 Personal history of transient ischemic attack (TIA), and cerebral infarction without residual deficits: Secondary | ICD-10-CM | POA: Diagnosis not present

## 2018-11-24 DIAGNOSIS — E039 Hypothyroidism, unspecified: Secondary | ICD-10-CM | POA: Diagnosis not present

## 2018-11-24 DIAGNOSIS — S4991XA Unspecified injury of right shoulder and upper arm, initial encounter: Secondary | ICD-10-CM | POA: Diagnosis present

## 2018-11-24 DIAGNOSIS — Y939 Activity, unspecified: Secondary | ICD-10-CM | POA: Diagnosis not present

## 2018-11-24 MED ORDER — TRAMADOL HCL 50 MG PO TABS
50.0000 mg | ORAL_TABLET | Freq: Once | ORAL | Status: AC
  Administered 2018-11-24: 50 mg via ORAL
  Filled 2018-11-24 (×2): qty 1

## 2018-11-24 NOTE — ED Provider Notes (Signed)
[facility] Emergency Department Provider Note ____________________________________________   First MD Initiated Contact with Patient 11/24/18 2214     (approximate)  I have reviewed the triage vital signs and the nursing notes.   HISTORY  Chief Complaint Fall    HPI Carol Lucero is a 85 y.o. female with PMH as noted below including multiple myeloma and currently on hospice who presents with right shoulder and left knee pain after a trip and fall from standing height.  The patient denies hitting her head.  She has no other injuries.  Past Medical History:  Diagnosis Date  . Coronary artery disease   . Diverticulosis of colon    seen on colonsocpy 5 years ago  . Glaucoma   . Hypothyroidism   . Stroke ([place])   . Thyroid disease     Patient Active Problem List   Diagnosis Date Noted  . Rectal bleeding 09/16/2017  . Rectal bleed 07/28/2014    Past Surgical History:  Procedure Laterality Date  . CORONARY ANGIOPLASTY    . uterine polypectomy N/A     Prior to Admission medications   Medication Sig Start Date End Date Taking? Authorizing Provider  brimonidine (ALPHAGAN) 0.2 % ophthalmic solution Place 1 drop into both eyes 2 (two) times daily.     [provider]  dorzolamide-timolol (COSOPT) 22.3-6.8 MG/ML ophthalmic solution Place 1 drop into both eyes 2 (two) times daily.    [provider]  liothyronine (CYTOMEL) 5 MCG tablet Take 10 mcg by mouth daily. 08/15/17   [provider]  NALTREXONE HCL PO Take 2 mg by mouth daily. 08/01/17   [provider]  NATURE-THROID 65 MG tablet Take 1 tablet by mouth daily. 09/14/17   [provider]  Travoprost, BAK Free, (TRAVATAN) 0.004 % SOLN ophthalmic solution Place 1 drop into both eyes at bedtime.     [provider]    Allergies Patient has no known allergies.  Family History  Problem Relation Age of Onset  . CAD Mother   . Hypertension  Mother   . CAD Father     Social History Social History   Tobacco Use  . Smoking status: Never Smoker  . Smokeless tobacco: Never Used  Substance Use Topics  . Alcohol use: No  . Drug use: Never    Review of Systems  Constitutional: No fever/chills Eyes: No visual changes. ENT: No sore throat. Cardiovascular: Denies chest pain. Respiratory: Denies shortness of breath. Gastrointestinal: No vomiting Genitourinary: Negative for flank pain.  Musculoskeletal: Negative for back pain.  Positive for right shoulder and left knee pain. Skin: Negative for rash. Neurological: Negative for headache.   ____________________________________________   PHYSICAL EXAM:  VITAL SIGNS: ED Triage Vitals  Enc Vitals Group     BP 11/24/18 2156 (!) 147/66     Pulse Rate 11/24/18 2156 85     Resp 11/24/18 2156 16     Temp 11/24/18 2156 (!) 97.5 F (36.4 C)     Temp Source 11/24/18 2156 Oral     SpO2 11/24/18 2151 95 %     Weight 11/24/18 2201 100 lb (45.4 kg)     Height 11/24/18 2201 '5\' 3"'  (1.6 m)     Head Circumference --      Peak Flow --      Pain Score 11/24/18 2201 0     Pain Loc --      Pain Edu? --      Excl. in [place]? --  Constitutional: Alert and oriented.  Comfortable appearing and in no acute distress. Eyes: Conjunctivae are normal.  Head: Atraumatic. Nose: No congestion/rhinnorhea. Mouth/Throat: Mucous membranes are moist.   Neck: Normal range of motion.  No midline spinal tenderness. Cardiovascular: Normal rate, regular rhythm.Good peripheral circulation. Respiratory: Normal respiratory effort.  No retractions.  Gastrointestinal: Soft and nontender. No distention.  Genitourinary: No flank tenderness. Musculoskeletal: No midline spinal tenderness.  Full range of motion of bilateral hips.  Pain on range of motion of left knee and right shoulder.  Intact distal pulses in all extremities. Neurologic:  Normal speech and language.  Motor and sensory intact in all  extremities. Skin:  Skin is warm and dry. No rash noted. Psychiatric: Mood and affect are normal. Speech and behavior are normal.  ____________________________________________   LABS (all labs ordered are listed, but only abnormal results are displayed)  Labs Reviewed - No data to display ____________________________________________  EKG   ____________________________________________  RADIOLOGY  XR R shoulder: Pathologic appearing fracture of proximal humerus XR L knee: No acute fracture  ____________________________________________   PROCEDURES  Procedure(s) performed: No  Procedures  Critical Care performed: No ____________________________________________   INITIAL IMPRESSION / ASSESSMENT AND PLAN / ED COURSE  Pertinent labs & imaging results that were available during my care of the patient were reviewed by me and considered in my medical decision making (see chart for details).  85 year old female with a history of multiple myeloma on hospice presents with right shoulder and left knee pain after a mechanical fall from standing height.  She denies a head injury or LOC.  She has no back or hip pain.  On exam, the patient has pain on range of motion of the right shoulder and left knee, but she is able to range the left knee fairly well.  She has no other significant trauma on exam.  We will obtain x-rays of the relevant areas and reassess.  ----------------------------------------- 11:38 PM on 11/24/2018 -----------------------------------------  X-ray shows pathologic appearing fracture of the right shoulder.  I consulted Dr. Rudene Christians from orthopedics who reviewed the images and recommended a shoulder immobilizer.  The patient is not a surgical candidate.  She is stable for discharge home at this time.  I discussed the results of the imaging with her and her family member.  Return precautions given, and they expressed understanding.   ____________________________________________   FINAL CLINICAL IMPRESSION(S) / ED DIAGNOSES  Final diagnoses:  Other closed nondisplaced fracture of proximal end of right humerus, initial encounter      NEW MEDICATIONS STARTED DURING THIS VISIT:  New Prescriptions   No medications on file     Note:  This document was prepared using Dragon voice recognition software and may include unintentional dictation errors.    Arta Silence, MD 11/24/18 [phone]

## 2018-11-24 NOTE — ED Notes (Signed)
Pt reports current myeloma

## 2018-11-24 NOTE — ED Triage Notes (Signed)
Pt arrived from home via [facility] following a mechanical fall approx. 1-2 hrs ago. Pt is alert and oriented x4. Pt reports pain to right shoulder and left knee. Pt has DNR and MOST forms, and is on hospice care. Pt only wants to be examined for injuries requiring simple care, like immobilization. EMS VS BP 142/86, HR 83, RR 16, SpO2 95% on RA.

## 2018-11-24 NOTE — Discharge Instructions (Addendum)
Continue to take tramadol as needed for pain.  Return to the ER for new, worsening, or persistent severe pain, numbness, or any other new or worsening symptoms that concern you.

## 2018-11-25 NOTE — ED Notes (Signed)
No peripheral IV placed this visit.    Discharge instructions reviewed with patient. Questions fielded by this RN. Patient verbalizes understanding of instructions. Patient discharged home in stable condition per saidecki. No acute distress noted at time of discharge.   Pt wheeled to car and loaded

## 2020-08-19 IMAGING — DX DG SHOULDER 2+V*R*
3 series · 3 of 3 positions shown · non-contrast
Comparison: 12/12/2017 report

CLINICAL DATA: Shoulder pain after fall

EXAM:
RIGHT SHOULDER - 2+ VIEW

[shoulder swimmer]
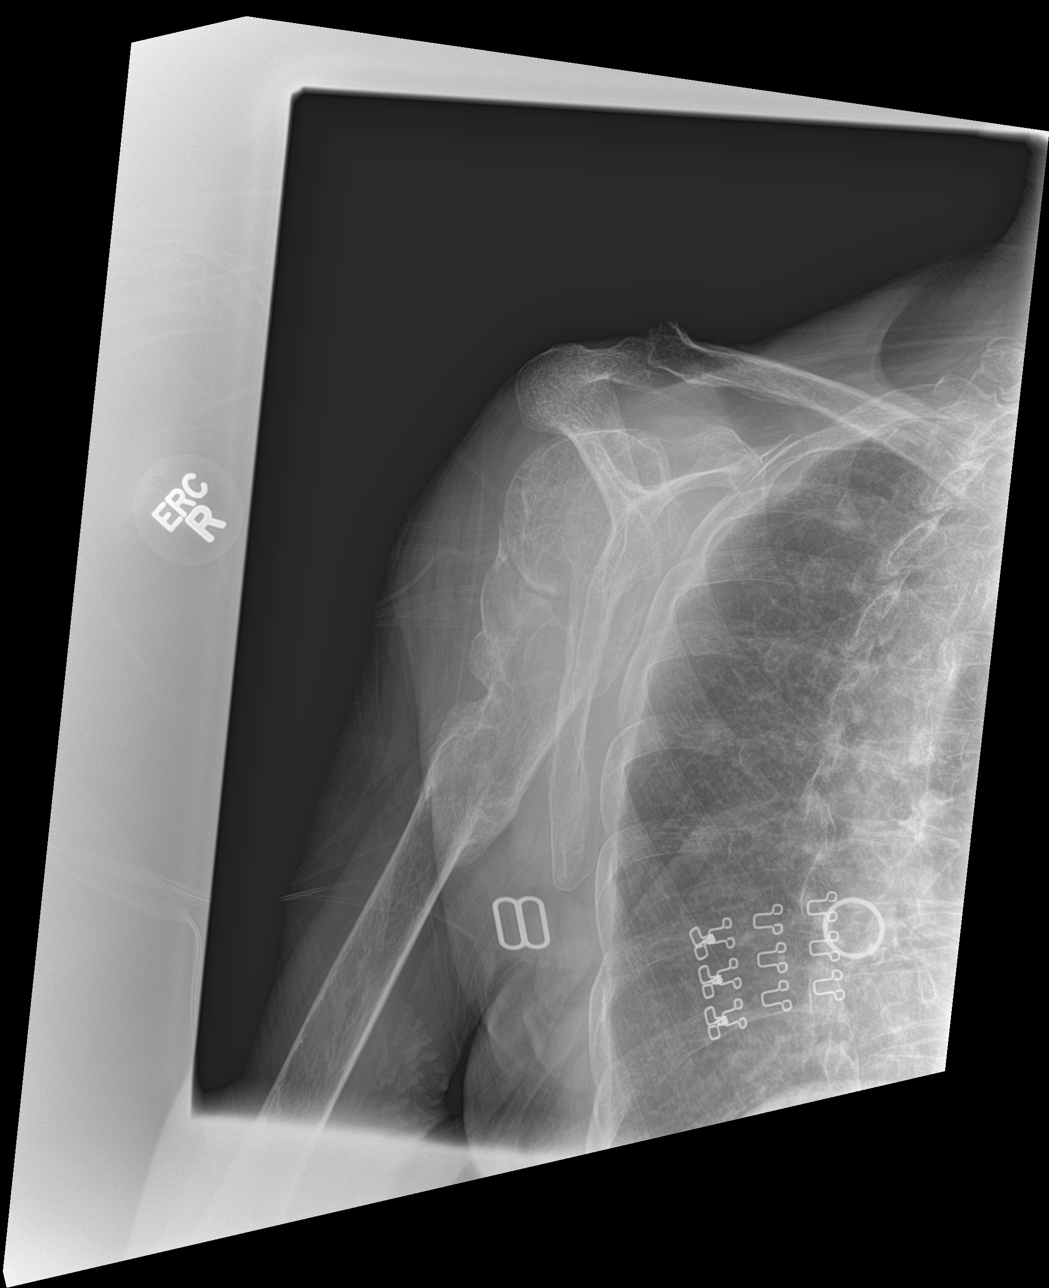

[shoulder ap]
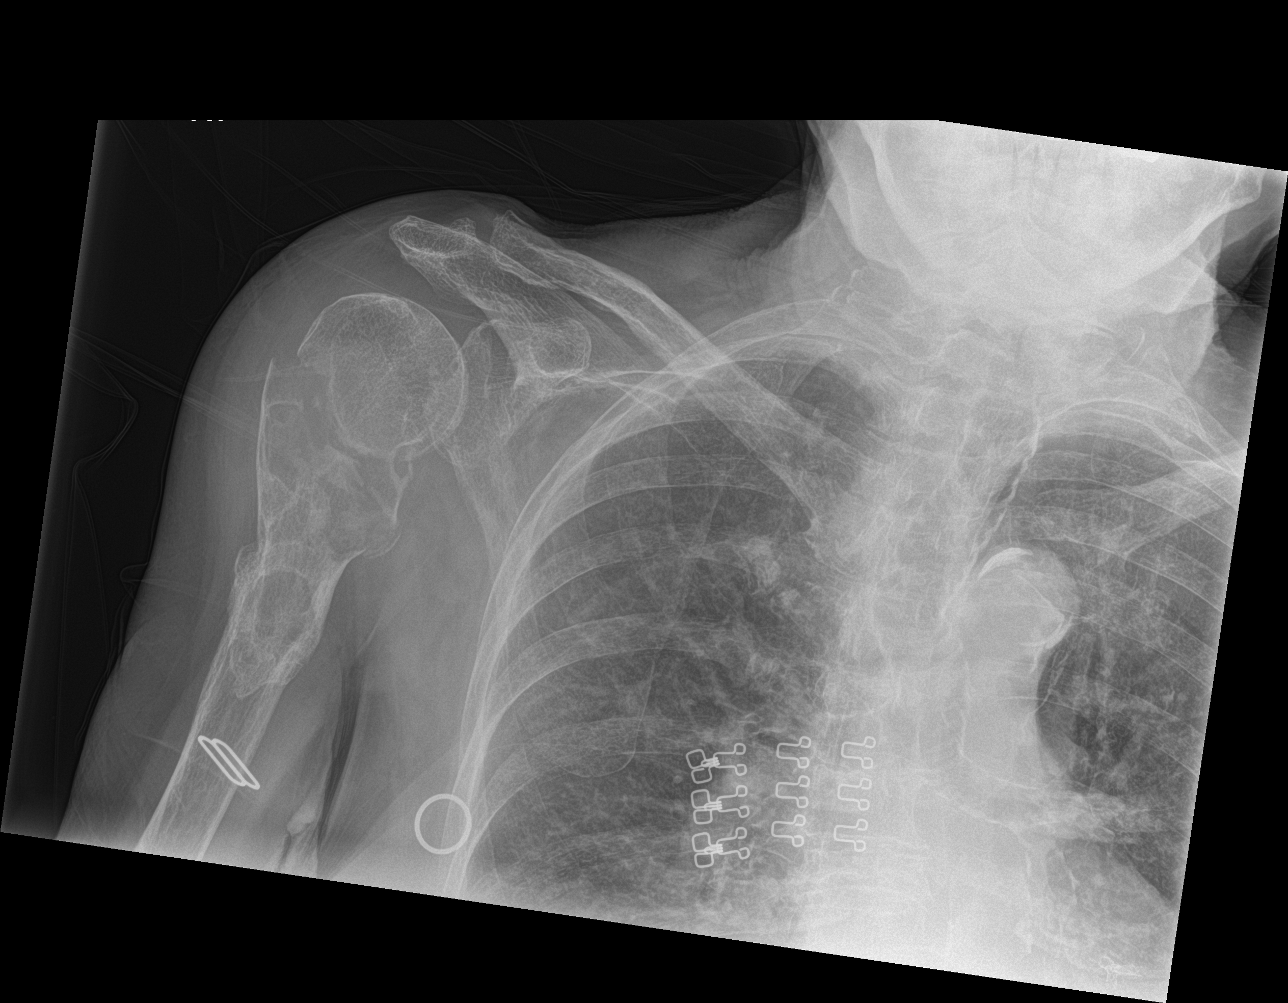

[shoulder obl]
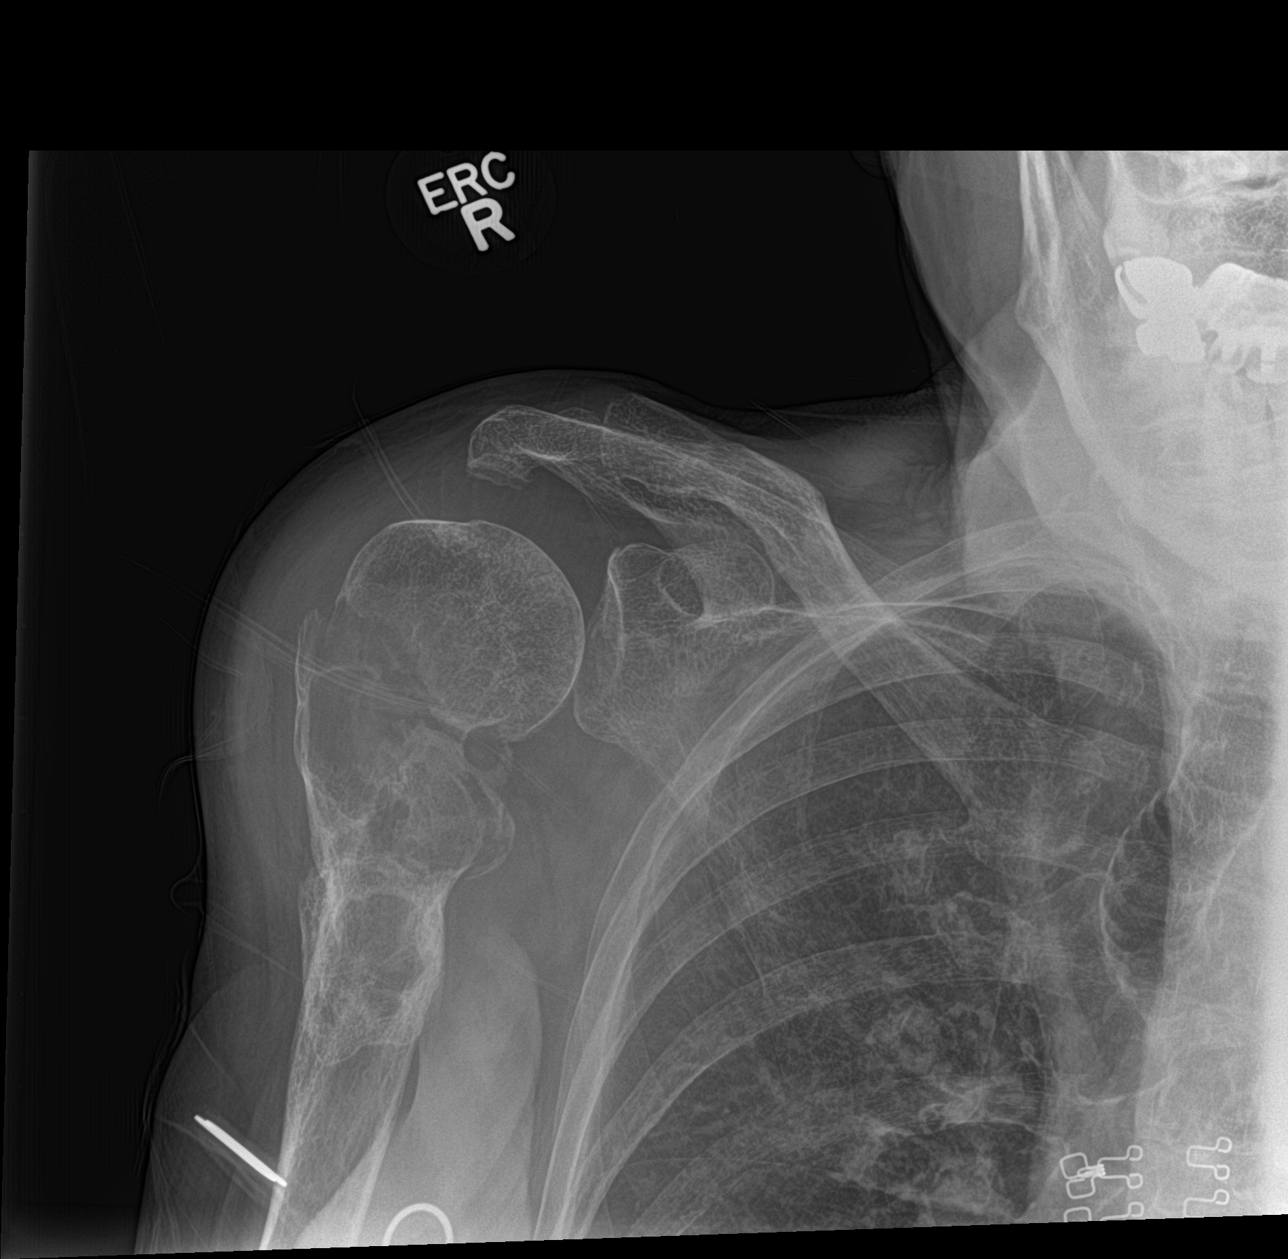

[3 of 3 positions shown; findings below may reference images not displayed]

FINDINGS: Mild AC joint degenerative change without widening. No humeral head
dislocation. Old fracture deformity of the proximal shaft of the
humerus. Lytic expansile lesion in the proximal right humerus. Acute
appearing pathologic fracture the right humeral neck with mild
displacement. There is overlying soft tissue swelling.
IMPRESSION: 1. Suspected acute pathologic fracture involving the right humeral
neck
2. Evidence of old healed fracture involving the proximal shaft of
the humerus
